# Patient Record
Sex: Female | Born: 1945 | Race: White | Hispanic: No | Marital: Married
Health system: Midwestern US, Academic
[De-identification: ages and names within clinical notes are randomized; demographics above are authoritative.]

---

## 2017-03-20 MED ORDER — CARVEDILOL 12.5 MG PO TAB
ORAL_TABLET | Freq: Two times a day (BID) | ORAL | 3 refills | 90.00000 days | Status: DC
Start: 2017-03-20 — End: 2018-04-08

## 2017-04-20 ENCOUNTER — Encounter: Admit: 2017-04-20 | Discharge: 2017-04-20 | Payer: MEDICARE

## 2017-04-20 NOTE — Telephone Encounter
-----   Message from Allen NorrisLisa Luikart, RN sent at 04/18/2017  2:25 PM CDT -----  Regarding: INR due 6/2  INR was 6.1 on Thursday (5/31). Pt will have rechecked on 6/2 at [facility], phone # is [phone] or the main hosp number is [phone].

## 2017-04-20 NOTE — Telephone Encounter
Called lab no inr drawn.  Called pt lmom requesting draw.

## 2017-04-21 ENCOUNTER — Encounter: Admit: 2017-04-21 | Discharge: 2017-04-21 | Payer: MEDICARE

## 2017-04-21 DIAGNOSIS — Z7901 Long term (current) use of anticoagulants: Principal | ICD-10-CM

## 2017-04-21 DIAGNOSIS — I480 Paroxysmal atrial fibrillation: ICD-10-CM

## 2017-04-21 DIAGNOSIS — Z8679 Personal history of other diseases of the circulatory system: ICD-10-CM

## 2017-04-21 NOTE — Progress Notes
INR 3.4 decreased from 6.1 - 3 days ago.        Assessment for high INR:    What doses of warfarin have you been taking? Holding X 2 days then 3mg  last evening  Have you had any extra doses? No  Have you had any diet changes (decreased foods high in Vitamin K)? No  Have you had any alcohol to drink?No  Any medication changes?  Yes - Antibiotics completedd 4 days ago  Antibiotic use? Yes but now completed  Any recent illness or hospitalization? Sinus Infection and Strep Throat  Have you had any surgery or other procedures? No  Have you had a fall or other injury No  Any unusual bruising or bleeding, severe headache,  frequent nosebleeds or bleeding gums, blood in urine, red or black stools, vomiting or coughing up blood, unexplained localized pain, swelling, or redness  No      Instructed patient to report any signs or symptoms of bleeding immediately.   Advised patient to avoid excessive physical activity and take care to prevent falls.  Instructed patient to call immediately with any fall or other injury.  Recommended additional servings of foods high in Vitamin K.

## 2017-04-24 ENCOUNTER — Encounter: Admit: 2017-04-24 | Discharge: 2017-04-24 | Payer: MEDICARE

## 2017-05-01 ENCOUNTER — Encounter: Admit: 2017-05-01 | Discharge: 2017-05-01 | Payer: MEDICARE

## 2017-05-10 ENCOUNTER — Encounter: Admit: 2017-05-10 | Discharge: 2017-05-10 | Payer: MEDICARE

## 2017-05-10 NOTE — Progress Notes
Spoke with pt relative to results, dosage and date of next INR.

## 2017-05-27 ENCOUNTER — Encounter: Admit: 2017-05-27 | Discharge: 2017-05-27 | Payer: MEDICARE

## 2017-05-27 DIAGNOSIS — I480 Paroxysmal atrial fibrillation: Principal | ICD-10-CM

## 2017-05-27 NOTE — Progress Notes
Faxed standing [facility] to [facility].

## 2017-06-05 ENCOUNTER — Encounter: Admit: 2017-06-05 | Discharge: 2017-06-05 | Payer: MEDICARE

## 2017-06-05 MED ORDER — WARFARIN 2 MG PO TAB
ORAL_TABLET | Freq: Every day | ORAL | 1 refills | 90.00000 days | Status: AC
Start: 2017-06-05 — End: 2017-12-20

## 2017-06-07 ENCOUNTER — Encounter: Admit: 2017-06-07 | Discharge: 2017-06-07 | Payer: MEDICARE

## 2017-06-26 ENCOUNTER — Encounter: Admit: 2017-06-26 | Discharge: 2017-06-26 | Payer: MEDICARE

## 2017-06-27 ENCOUNTER — Ambulatory Visit: Admit: 2017-06-27 | Discharge: 2017-06-28 | Payer: MEDICARE

## 2017-06-27 ENCOUNTER — Encounter: Admit: 2017-06-27 | Discharge: 2017-06-27 | Payer: MEDICARE

## 2017-06-27 DIAGNOSIS — I10 Essential (primary) hypertension: ICD-10-CM

## 2017-06-27 DIAGNOSIS — I471 Supraventricular tachycardia: ICD-10-CM

## 2017-06-27 DIAGNOSIS — E785 Hyperlipidemia, unspecified: ICD-10-CM

## 2017-06-27 DIAGNOSIS — D509 Iron deficiency anemia, unspecified: ICD-10-CM

## 2017-06-27 DIAGNOSIS — E119 Type 2 diabetes mellitus without complications: ICD-10-CM

## 2017-06-27 DIAGNOSIS — I480 Paroxysmal atrial fibrillation: ICD-10-CM

## 2017-06-27 DIAGNOSIS — Z7901 Long term (current) use of anticoagulants: ICD-10-CM

## 2017-06-27 DIAGNOSIS — E538 Deficiency of other specified B group vitamins: ICD-10-CM

## 2017-06-27 DIAGNOSIS — I4892 Unspecified atrial flutter: ICD-10-CM

## 2017-06-27 NOTE — Progress Notes
Patient is scheduled for sinus surgery at [facility] with Dr. Susann GivensFranklin on 8/22.  Pt is coumadin and will need to hold 5 days prior to surgery.  [facility] will address coumadin in OV note and addendum OV with testing is complete.     [facility] [phone] or call [phone] ext [phone].

## 2017-07-02 ENCOUNTER — Ambulatory Visit: Admit: 2017-07-02 | Discharge: 2017-07-02 | Payer: MEDICARE

## 2017-07-02 DIAGNOSIS — I480 Paroxysmal atrial fibrillation: Principal | ICD-10-CM

## 2017-07-02 DIAGNOSIS — I10 Essential (primary) hypertension: ICD-10-CM

## 2017-07-03 ENCOUNTER — Encounter: Admit: 2017-07-03 | Discharge: 2017-07-03 | Payer: MEDICARE

## 2017-07-03 NOTE — Telephone Encounter
-----   Message from Hester MatesSteven B Gollub, MD sent at 07/02/2017  3:20 PM CDT -----  Franchot MimesLisa and Steve: Ms. Culbreth's echo Doppler showed normal left ventricular systolic function with severe left atrial enlargement.  There are no surprises on her echo Doppler study.  Please let her know.  Thanks. SBG  ----- Message -----  From: Hester MatesGollub, Steven B, MD  Sent: 07/02/2017   3:19 PM  To: Hester MatesSteven B Gollub, MD

## 2017-07-03 NOTE — Telephone Encounter
Results and recommendations called to patient.

## 2017-07-04 ENCOUNTER — Encounter: Admit: 2017-07-04 | Discharge: 2017-07-04 | Payer: MEDICARE

## 2017-07-05 ENCOUNTER — Ambulatory Visit: Admit: 2017-07-05 | Discharge: 2017-07-06 | Payer: MEDICARE

## 2017-07-05 DIAGNOSIS — I480 Paroxysmal atrial fibrillation: Principal | ICD-10-CM

## 2017-07-05 DIAGNOSIS — I10 Essential (primary) hypertension: ICD-10-CM

## 2017-07-05 MED ORDER — SODIUM CHLORIDE 0.9 % IV SOLP
250 mL | INTRAVENOUS | 0 refills | Status: AC | PRN
Start: 2017-07-05 — End: ?

## 2017-07-05 MED ORDER — ALBUTEROL SULFATE 90 MCG/ACTUATION IN HFAA
2 | RESPIRATORY_TRACT | 0 refills | Status: DC | PRN
Start: 2017-07-05 — End: 2017-07-10

## 2017-07-05 MED ORDER — REGADENOSON 0.4 MG/5 ML IV SYRG
.4 mg | Freq: Once | INTRAVENOUS | 0 refills | Status: CP
Start: 2017-07-05 — End: ?

## 2017-07-05 MED ORDER — NITROGLYCERIN 0.4 MG SL SUBL
.4 mg | SUBLINGUAL | 0 refills | Status: AC | PRN
Start: 2017-07-05 — End: ?

## 2017-07-05 MED ORDER — AMINOPHYLLINE 500 MG/20 ML IV SOLN
50 mg | INTRAVENOUS | 0 refills | Status: AC | PRN
Start: 2017-07-05 — End: ?

## 2017-07-08 NOTE — Progress Notes
Risk stratification note faxed to [facility].

## 2017-07-17 LAB — COMPREHENSIVE METABOLIC PANEL
Lab: 0.7
Lab: 1.4 — ABNORMAL HIGH (ref 0.57–1.11)
Lab: 10
Lab: 134 — ABNORMAL LOW (ref 136–145)
Lab: 14
Lab: 15 — ABNORMAL HIGH (ref 0–14)
Lab: 19 — ABNORMAL HIGH (ref 0.57–1.11)
Lab: 228 — ABNORMAL HIGH (ref 80–115)
Lab: 23
Lab: 3.1 — ABNORMAL LOW (ref 3.4–4.8)
Lab: 37
Lab: 43
Lab: 7.8
Lab: 74
Lab: 8.9 — ABNORMAL HIGH (ref 11.5–14.5)

## 2017-07-17 LAB — TROPONIN-I

## 2017-07-17 LAB — CREATINE KINASE-CPK: Lab: 66 — ABNORMAL LOW (ref 37.0–47.0)

## 2017-07-18 ENCOUNTER — Encounter: Admit: 2017-07-18 | Discharge: 2017-07-18 | Payer: MEDICARE

## 2017-07-18 ENCOUNTER — Ambulatory Visit: Admit: 2017-07-18 | Discharge: 2017-07-19 | Payer: MEDICARE

## 2017-07-18 DIAGNOSIS — I2601 Septic pulmonary embolism with acute cor pulmonale: Principal | ICD-10-CM

## 2017-07-19 LAB — CBC: Lab: 7.4

## 2017-07-19 LAB — BASIC METABOLIC PANEL: Lab: 134 — ABNORMAL LOW (ref 136–145)

## 2017-07-31 ENCOUNTER — Encounter: Admit: 2017-07-31 | Discharge: 2017-07-31 | Payer: MEDICARE

## 2017-08-01 ENCOUNTER — Encounter: Admit: 2017-08-01 | Discharge: 2017-08-01 | Payer: MEDICARE

## 2017-08-01 LAB — COMPREHENSIVE METABOLIC PANEL
Lab: 0.4
Lab: 1.3 — ABNORMAL HIGH (ref 0.57–1.11)
Lab: 101
Lab: 13
Lab: 134 — ABNORMAL LOW (ref 136–145)
Lab: 156 — ABNORMAL HIGH (ref 80–115)
Lab: 16
Lab: 19
Lab: 20 — ABNORMAL LOW (ref 33.0–37.0)
Lab: 24
Lab: 3.1 — ABNORMAL LOW (ref 3.4–4.8)
Lab: 3.8
Lab: 8

## 2017-08-01 LAB — CBC
Lab: 4 — ABNORMAL LOW (ref 4.8–10.8)
Lab: 4.3

## 2017-08-01 LAB — TROPONIN-I

## 2017-08-01 LAB — MYOGLOBIN-CHEM: Lab: 74

## 2017-08-06 ENCOUNTER — Encounter: Admit: 2017-08-06 | Discharge: 2017-08-06 | Payer: MEDICARE

## 2017-08-06 ENCOUNTER — Ambulatory Visit: Admit: 2017-08-06 | Discharge: 2017-08-07 | Payer: MEDICARE

## 2017-08-06 DIAGNOSIS — D509 Iron deficiency anemia, unspecified: ICD-10-CM

## 2017-08-06 DIAGNOSIS — I10 Essential (primary) hypertension: Principal | ICD-10-CM

## 2017-08-06 DIAGNOSIS — I480 Paroxysmal atrial fibrillation: Secondary | ICD-10-CM

## 2017-08-06 DIAGNOSIS — I471 Supraventricular tachycardia: ICD-10-CM

## 2017-08-06 DIAGNOSIS — I4892 Unspecified atrial flutter: ICD-10-CM

## 2017-08-06 DIAGNOSIS — E538 Deficiency of other specified B group vitamins: ICD-10-CM

## 2017-08-06 DIAGNOSIS — R55 Syncope and collapse: Principal | ICD-10-CM

## 2017-08-06 DIAGNOSIS — E119 Type 2 diabetes mellitus without complications: ICD-10-CM

## 2017-08-06 DIAGNOSIS — Z7901 Long term (current) use of anticoagulants: ICD-10-CM

## 2017-08-06 DIAGNOSIS — E785 Hyperlipidemia, unspecified: ICD-10-CM

## 2017-08-06 MED ORDER — ENOXAPARIN 120 MG/0.8 ML SC SYRG
120 mg | INJECTION | Freq: Two times a day (BID) | SUBCUTANEOUS | 1 refills | 7.00000 days | Status: AC
Start: 2017-08-06 — End: 2017-09-19

## 2017-08-06 NOTE — Progress Notes
Date of Service: 08/06/2017    Sandra Parks is a 71 y.o. female.       HPI     Sandra Parks is followed for paroxysmal atrial flutter/fibrillation, hypertension, and supraventricular tachycardia.  She is also followed for hyperlipidemia and diabetes mellitus. ???The patient reports having a persistent sinus infection over the wintertime associated with ocular migraines.    She stopped her warfarin in preparation for sinus surgery and underwent sinus surgery the week of July 08, 2017.  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  She was hospitalized until 07/22/2017.  Her warfarin was restarted.  On 08/01/2017 she had a syncopal episode shortly after urinating and then standing.  She felt lightheaded prior to losing consciousness and she reports that she fractured her left hand and is being scheduled for surgery.   Sandra Parks also reports chronic back pain which limits her activity. ???Otherwise, from a cardiovascular perspective, the patient has been stable over the past month and she reports no angina, congestive symptoms, palpitations, or sensation of sustained forceful heart pounding. Her exercise tolerance has been stable but???is???limited by???chronic low back discomfort and???left knee arthritis. The patient reports no myalgias, bleeding abnormalities, neurologic motor abnormalities or difficulty with speech.  ???  Historically, in February 2004, she underwent radiofrequency ablation for atrial flutter, although this appeared to be only 1 of the supraventricular arrhythmias that she was having. She has been on Coumadin because her CHA2DS2-VASc score for stroke and systemic embolization risk is now 4, namely for gender, age, hypertension and diabetes mellitus.   In December 2012 warfarin was stopped 4-5 days prior to surgery to repair her left rotator cuff. The patient reports that surgery was uneventful. Warfarin was resumed after surgery but stopped, once again, 4-5 days prior to surgery to repair her right rotator cuff on 01/03/12. On 01/06/12 she became short of breath and on 01/07/12 she was admitted to [facility] with a diagnosis of pulmonary embolism. Sandra Parks was treated with unfractionated heparin and then warfarin was resumed. 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 She was hospitalized from 12/09/13 thru 12/11/13 for sinusitis but also reports treatment for bronchitis and pneumonia. She required treatment with steroids which made her blood pressure and blood sugar difficult to control. Sandra Parks reports that she was hospitalized again for approximately 4 days in February 2015 for a sinus infection requiring antibiotic therapy. The patient reports that she had her sinuses irrigated as an outpatient in March 2015 and has had no recurrent sinus infections. The patient fell on 01/19/15 and suffered a comminuted fracture of the distal left radius. She reports that she did not require surgery and was place in a protective sling. She reports one other fall. She states that she will become lightheaded with standing for 15 minutes and now knows to prevent prolonged standing. Due to her orthostatic symptoms, her hydralazine was discontinued and her carvedilol was reduced to 12.5 mg twice daily from 25 mg twice daily. She felt much better after this dose reduction.       Vitals:    08/06/17 1408   BP: 120/72   Pulse: 83   Weight: 122.5 kg (270 lb)   Height: 1.753 m (5' 9)     Body mass index is 39.87 kg/m???.     Past Medical History  Patient Active Problem List    Diagnosis Date Noted   ??? History of pulmonary embolism 05/28/2012   ???  Syncope 01/14/2012   ??? History of atrial flutter 01/14/2012     2004: RFA     ??? AKI (acute kidney injury) (HCC) 01/14/2012   ??? Chronic anticoagulation 01/14/2012   ??? Iron deficiency anemia 01/13/2012   ??? B12 deficiency 01/13/2012 ??? 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 Hypertension 07/12/2009   ??? Supraventricular tachycardia (HCC) 07/12/2009   ??? Hyperlipidemia 07/12/2009   ??? Type II diabetes mellitus (HCC) 07/12/2009   ??? PAF (paroxysmal atrial fibrillation) (HCC) 04/12/2009         Review of Systems   Constitution: Positive for weakness and malaise/fatigue.   HENT: Positive for nosebleeds and tinnitus.    Eyes: Negative.    Cardiovascular: Positive for dyspnea on exertion and leg swelling.   Endocrine: Positive for polydipsia.   Hematologic/Lymphatic: Negative.    Skin: Positive for dry skin.   Musculoskeletal: Positive for arthritis, back pain, falls, joint pain and myalgias.   Gastrointestinal: Positive for constipation.   Genitourinary: Positive for decreased libido.   Neurological: Positive for disturbances in coordination and loss of balance.   Psychiatric/Behavioral: Negative.    Allergic/Immunologic: Negative.        Physical Exam  GENERAL: The patient is well developed, well nourished, resting comfortably and in no distress. ???  HEENT: No abnormalities of the visible oro-nasopharynx, conjunctiva or sclera are noted.  NECK: There is no jugular venous distension. Carotids are palpable and without bruits. There is no thyroid enlargement.  Chest: Lung fields are clear to auscultation. There are no wheezes or crackles.  CV: There is a regular rhythm. The first and second heart sounds are normal. There are no murmurs, gallops or rubs. Her apical heart rate is 72???BPM.  ABD: The abdomen is soft and supple with normal bowel sounds. There is no hepatosplenomegaly, ascites, tenderness, masses or bruits.  Neuro: There are no focal motor defects. Ambulation is normal. Cognitive function appears normal.  Ext:???There is trace bipedal edema without evidence of deep vein thrombosis. She does have 1+ bilateral lower leg edema. ???Peripheral pulses are satisfactory. ???  SKIN:???There are no rashes and no cellulitis PSYCH:???The patient is calm, rationale and oriented.    Cardiovascular Studies  A 12 lead ECG was obtained on 08/06/2017 shows normal sinus rhythm with a heart rate of 72 bpm.  There is no evidence of myocardial ischemia or infarction.  There is no significant change when compared to her prior ECG from June 27, 2017.    An echo Doppler study obtained on 07/02/2017 revealed:  1. No regional wall motion abnormalities are seen. Overall LV systolic function appears normal. The estimated left ventricular ejection fraction is 55-60%. Left ventricular contractility appears similar when compared with the prior echocardiogram performed on 04/22/13.  2. Right ventricular contractility appears normal.  3. Severe left atrial enlargement. ???Mild right atrial enlargement  4. Mild mitral valve regurgitation.  5. No pericardial effusion is seen.  6. The estimated peak systolic PA pressure equals 44 mmHg.  ???  A regadenoson thallium stress study obtained on 07/05/2017 revealed:   Regional Wall Thickening and Motion Post Stress: ??????There is borderline normal left ventricular wall motion and thickening of all myocardial segments.  Left Ventricular Ejection Fraction (post stress, in the resting state) =??????49 %. Left Ventricular End Diastolic Volume: 469 mL. ???  SUMMARY/OPINION:??????This study is probably normal with no evidence of significant myocardial ischemia. There is fixed attenuation in the anterior and anterolateral wall that is likely due to breast attenuation althougha prior injury  cannot be excluded. ???Left ventricular systolic function is borderline normal. There are no high risk prognostic indicators present. ???The ECG portion of the study is negative for ischemia. Comparison is made with a prior study completed 201`4. ???Ejection fraction was 52%. ???There are no significant changes. The anterior attenuation that is seen on the current image was present on the prior study. In aggregate the current study is low risk in regards to predicted annual cardiovascular mortality rate.  ???  Problems Addressed Today  Paroxysmal atrial fibrillation.  Hypertension.  Supraventricular tachycardia.  Pulmonary embolism.  Assessment and Plan     A) Based upon the results of the patient's clinical profile and non-invasive cardiovascular testing, the risk for cardiovascular morbidity and mortality associated with non-cardiac surgery is ELEVATED. This by no means precludes surgery but provides information for the surgeon, anesthesiologist and patient concerning the risks of surgery.  A decision to proceed with surgery should be made based upon the relative benefits and risks involved. Indeed, orthopedic surgery for her broken left hand could be a very helpful intervention for this patient's overall health maintenance and could prove very helpful in providing comfort for the patient and preventing possible complications related to loss of mobility and function of her left hand. There are no absolute contra-indications to elective surgery, as deemed necessary. No additional cardiovascular testing is required at this time.  ???  B) Ms. Folkerts has paroxysmal atrial fibrillation. ???Her CHA2DS2-VASc score for stroke and systemic embolization risk is now 4, namely for gender, age, hypertension and diabetes mellitus. ???She also had a complication of pulmonary embolism following orthopedic surgery in 2013 and recently had recurrent pulmonary embolism when she held her warfarin in the perioperative period for sinus surgery. The risks and benefits of bridging anticoagulation therapy have been reviewed with the patient. The patient understands that anticoagulation is used to decrease thrombotic or clotting complications such as stroke, systemic embolization and pulmonary embolization which can be disabling or fatal, but can, on occasion, lead to life-threatening or disabling bleeding complications including gastrointestinal and intracranial hemorrhage. The patient wishes to use bridging anticoagulation when her warfarin is held in preparation of orthopedic surgery for her broken left hand.  A strict schedule for bridging anticoagulation was given to the patient.  Following upcoming orthopedic surgery I have asked Ms. Fuhrer to continue working with the cardiovascular nurses so that her INR can be monitored and her dose of warfarin adjusted accordingly. I have asked Ms. Espey to continue working with her primary care physician for control of her diabetes mellitus.     C) Ms. Goedken's recent syncope may be orthostatic, or reflex mediated.  It did occur after urination and with standing.  Her blood pressure is well controlled and I have asked her to stop her lisinopril which could help improve her chronic orthostasis.  She has had recent echocardiography and stress testing.  However in order to evaluate for arrhythmias, I have asked her to use an event monitor, a ZI0patch, as soon as it can be scheduled. The patient has been instructed not to drive and to avoid working with power tools, amusement rides such as roller coasters and ferris wheels or places of elevation such as rooftops or ladders where loss of consciousness could pose life-threatening harm to herself or others.  I have asked her to return for follow-up in approximately 1 month to follow her progress.            Current Medications (including today's revisions)  ??? carvedilol (  COREG) 12.5 mg tablet TAKE ONE TABLET BY MOUTH TWICE DAILY (MUST MAKE OFFICE VISIT FOR ANY ADDITIONAL REFILLS)   ??? glimepiride (AMARYL) 2 mg PO tablet Take 2 mg by mouth twice daily.   ??? insulin degludec 100 unit/mL (3 mL) inpn Inject 40 Units under the skin at bedtime daily.   ??? lisinopril (PRINIVIL; ZESTRIL) 10 mg tablet Take 10 mg by mouth daily.   ??? multivitamin (MULTIPLE VITAMIN) per tablet Take 1 Tab by mouth Daily.   ??? omeprazole (PRILOSEC) 40 mg capsule Take 40 mg by mouth Daily. ??? potassium chloride (KLOR-CON) 20 mEq packet Take 20 mEq by mouth daily.   ??? rosuvastatin (CRESTOR) 20 mg tablet Take 1 tablet by mouth daily.   ??? Saxagliptin 2.5 mg tab Take 1 tablet by mouth every morning.   ??? sertraline (ZOLOFT) 50 mg tablet Take 50 mg by mouth at bedtime daily.   ??? triamterene-hydrochlorothiazide (MAXZIDE) 37.5-25 mg tablet TAKE ONE TABLET BY MOUTH ONCE DAILY   ??? warfarin (COUMADIN) 2 mg tablet TAKE 1 TABLET BY MOUTH ONCE DAILY AS DIRECTED BY [facility] FOR MANAGEMENT OF INR   ??? warfarin (COUMADIN) 6 mg tablet TAKE ONE TABLET BY MOUTH ONCE DAILY AS DIRECTED

## 2017-08-06 NOTE — Progress Notes
Patient was identified as a high fall risk with fall risk screen today in clinic.   Yellow fall risk banner now active in patient's chart. High Fall risk sign posted outside of the patient's room.   Patient and family were given the CDC "How you can prevent falls" brochure and educated by clinic RN regarding fall risk prevention.     High Risk Assessment:    Three or more co-existing diagnosis: yes  Incontinency/urgency/frequency (diuretic use): no  Fall in the past 6 months: yes  Taking four or more prescribed medications (consider medications that could affect gait, balance, strength, judgement): yes  Impaired functional mobility: yes    Patient will be accompanied by a staff member while ambulating in clinic via assist or wheelchair. High fall risk status will be communicated to receiving staff if patient visits another department while under [place] staff care. Patient verbalizes an understanding.

## 2017-08-08 ENCOUNTER — Encounter: Admit: 2017-08-08 | Discharge: 2017-08-08 | Payer: MEDICARE

## 2017-08-09 ENCOUNTER — Encounter: Admit: 2017-08-09 | Discharge: 2017-08-09 | Payer: MEDICARE

## 2017-08-15 ENCOUNTER — Ambulatory Visit: Admit: 2017-08-15 | Discharge: 2017-08-16 | Payer: MEDICARE

## 2017-08-15 ENCOUNTER — Encounter: Admit: 2017-08-15 | Discharge: 2017-08-15 | Payer: MEDICARE

## 2017-08-15 DIAGNOSIS — I480 Paroxysmal atrial fibrillation: ICD-10-CM

## 2017-08-15 DIAGNOSIS — R55 Syncope and collapse: Principal | ICD-10-CM

## 2017-08-15 DIAGNOSIS — I471 Supraventricular tachycardia: ICD-10-CM

## 2017-08-16 ENCOUNTER — Encounter: Admit: 2017-08-16 | Discharge: 2017-08-16 | Payer: MEDICARE

## 2017-08-23 ENCOUNTER — Encounter: Admit: 2017-08-23 | Discharge: 2017-08-23 | Payer: MEDICARE

## 2017-08-23 DIAGNOSIS — Z8679 Personal history of other diseases of the circulatory system: ICD-10-CM

## 2017-08-23 DIAGNOSIS — I480 Paroxysmal atrial fibrillation: ICD-10-CM

## 2017-08-23 DIAGNOSIS — Z7901 Long term (current) use of anticoagulants: Principal | ICD-10-CM

## 2017-08-30 ENCOUNTER — Encounter: Admit: 2017-08-30 | Discharge: 2017-08-30 | Payer: MEDICARE

## 2017-08-30 DIAGNOSIS — I480 Paroxysmal atrial fibrillation: ICD-10-CM

## 2017-08-30 DIAGNOSIS — Z7901 Long term (current) use of anticoagulants: Principal | ICD-10-CM

## 2017-08-30 DIAGNOSIS — Z8679 Personal history of other diseases of the circulatory system: ICD-10-CM

## 2017-09-02 ENCOUNTER — Encounter: Admit: 2017-09-02 | Discharge: 2017-09-02 | Payer: MEDICARE

## 2017-09-06 ENCOUNTER — Encounter: Admit: 2017-09-06 | Discharge: 2017-09-06 | Payer: MEDICARE

## 2017-09-06 DIAGNOSIS — Z7901 Long term (current) use of anticoagulants: ICD-10-CM

## 2017-09-06 DIAGNOSIS — Z8679 Personal history of other diseases of the circulatory system: ICD-10-CM

## 2017-09-06 DIAGNOSIS — I480 Paroxysmal atrial fibrillation: Principal | ICD-10-CM

## 2017-09-19 ENCOUNTER — Encounter: Admit: 2017-09-19 | Discharge: 2017-09-19 | Payer: MEDICARE

## 2017-09-19 ENCOUNTER — Ambulatory Visit: Admit: 2017-09-19 | Discharge: 2017-09-20 | Payer: MEDICARE

## 2017-09-19 DIAGNOSIS — E785 Hyperlipidemia, unspecified: ICD-10-CM

## 2017-09-19 DIAGNOSIS — I480 Paroxysmal atrial fibrillation: Secondary | ICD-10-CM

## 2017-09-19 DIAGNOSIS — E538 Deficiency of other specified B group vitamins: ICD-10-CM

## 2017-09-19 DIAGNOSIS — I10 Essential (primary) hypertension: Principal | ICD-10-CM

## 2017-09-19 DIAGNOSIS — Z7901 Long term (current) use of anticoagulants: ICD-10-CM

## 2017-09-19 DIAGNOSIS — E119 Type 2 diabetes mellitus without complications: Secondary | ICD-10-CM

## 2017-09-19 DIAGNOSIS — I951 Orthostatic hypotension: ICD-10-CM

## 2017-09-19 DIAGNOSIS — D509 Iron deficiency anemia, unspecified: ICD-10-CM

## 2017-09-19 DIAGNOSIS — I471 Supraventricular tachycardia: ICD-10-CM

## 2017-09-19 DIAGNOSIS — I4892 Unspecified atrial flutter: ICD-10-CM

## 2017-09-19 NOTE — Progress Notes
Date of Service: 09/19/2017    Sandra Parks is a 71 y.o. female.       HPI     Sandra Parks is followed for paroxysmal atrial flutter/fibrillation, hypertension, and supraventricular tachycardia.  She is also followed for hyperlipidemia and diabetes mellitus. ???The patient reports having a persistent sinus infection over the wintertime 2017/2018 associated with ocular migraines. She stopped her warfarin in preparation for sinus surgery and underwent sinus surgery the week of July 08, 2017.  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  She was hospitalized until 07/22/2017.  Her warfarin was restarted.  On 08/01/2017 she had a syncopal episode shortly after urinating and then standing.  She felt lightheaded prior to losing consciousness and she reports that she fractured her left hand.  This was repaired surgically in late September 2018.  This time she received anticoagulation bridging when her warfarin was stopped in preparation for orthopedic surgery and she reports no complications from surgery.  She has restarted her warfarin.  When I saw Sandra Parks on August 06, 2017 her lisinopril was stopped to decrease the likelihood of orthostasis. She reports minimal if any orthostasis over the past month. ???Sandra Parks???also reports chronic back pain which limits her activity. ???Otherwise, from a cardiovascular perspective, the patient has been stable over the past month and she reports no angina, congestive symptoms, palpitations, or sensation of sustained forceful heart pounding. Her exercise tolerance has been stable but???is???limited by???chronic low back discomfort and???left knee arthritis. The patient reports no myalgias, bleeding abnormalities, neurologic motor abnormalities or difficulty with speech.  ???  Historically, in February 2004, she underwent radiofrequency ablation for atrial flutter, although this appeared to be only 1 of the supraventricular arrhythmias that she was having. She has been on Coumadin because her CHA2DS2-VASc score for stroke and systemic embolization risk is now 4, namely for gender, age, hypertension and diabetes mellitus. In December 2012 warfarin was stopped 4-5 days prior to surgery to repair her left rotator cuff. The patient reports that surgery was uneventful. Warfarin was resumed after surgery but stopped, once again, 4-5 days prior to surgery to repair her right rotator cuff on 01/03/12. On 01/06/12 she became short of breath and on 01/07/12 she was admitted to [facility] with a diagnosis of pulmonary embolism. Sandra Parks was treated with unfractionated heparin and then warfarin was resumed. 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 She was hospitalized from 12/09/13 thru 12/11/13 for sinusitis but also reports treatment for bronchitis and pneumonia. She required treatment with steroids which made her blood pressure and blood sugar difficult to control. Sandra Parks reports that she was hospitalized again for approximately 4 days in February 2015 for a sinus infection requiring antibiotic therapy. The patient reports that she had her sinuses irrigated as an outpatient in March 2015 and has had no recurrent sinus infections. The patient fell on 01/19/15 and suffered a comminuted fracture of the distal left radius. She reports that she did not require surgery and was place in a protective sling. She reports one other fall.  Her falls were associated with lightheaded which occurred with standing for 15 minutes. Due to her orthostatic symptoms, her hydralazine was discontinued and her carvedilol was reduced to 12.5 mg twice daily from 25 mg twice daily. She felt much better after this dose reduction.       Vitals:    09/19/17 1011 09/19/17 1021   BP: 124/84 128/82  Pulse: 63    Weight: 122.7 kg (270 lb 9.6 oz)    Height: 1.753 m (5' 9)      Body mass index is 39.96 kg/m???. Past Medical History  Patient Active Problem List    Diagnosis Date Noted   ??? History of pulmonary embolism 05/28/2012   ??? Syncope 01/14/2012   ??? History of atrial flutter 01/14/2012     2004: RFA     ??? AKI (acute kidney injury) (HCC) 01/14/2012   ??? Chronic anticoagulation 01/14/2012   ??? Iron deficiency anemia 01/13/2012   ??? B12 deficiency 01/13/2012   ??? 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 Hypertension 07/12/2009   ??? Supraventricular tachycardia (HCC) 07/12/2009   ??? Hyperlipidemia 07/12/2009   ??? Type II diabetes mellitus (HCC) 07/12/2009   ??? PAF (paroxysmal atrial fibrillation) (HCC) 04/12/2009         Review of Systems   Constitution: Positive for malaise/fatigue.   HENT: Positive for tinnitus.    Eyes: Negative.    Cardiovascular: Positive for dyspnea on exertion and leg swelling.   Respiratory: Positive for cough.    Endocrine: Positive for cold intolerance and heat intolerance.   Hematologic/Lymphatic: Negative.    Skin: Positive for dry skin.   Musculoskeletal: Positive for arthritis, back pain and neck pain.   Gastrointestinal: Positive for constipation and diarrhea.   Genitourinary: Negative.    Neurological: Negative.    Psychiatric/Behavioral: Negative.    Allergic/Immunologic: Negative.        Physical Exam  GENERAL: The patient is well developed, well nourished, resting comfortably and in no distress. ???  HEENT: No abnormalities of the visible oro-nasopharynx, conjunctiva or sclera are noted.  NECK: There is no jugular venous distension. Carotids are palpable and without bruits. There is no thyroid enlargement.  Chest: Lung fields are clear to auscultation. There are no wheezes or crackles.  CV: There is a regular rhythm. The first and second heart sounds are normal. There are no murmurs, gallops or rubs. Her apical heart rate is 64???BPM.  ABD: The abdomen is soft and supple with normal bowel sounds. There is no hepatosplenomegaly, ascites, tenderness, masses or bruits. Neuro: There are no focal motor defects. Ambulation is normal. Cognitive function appears normal.  Ext:???There is trace bipedal edema without evidence of deep vein thrombosis. She does have 1+ bilateral lower leg edema. ???Peripheral pulses are satisfactory. ???  SKIN:???There are no rashes and no cellulitis  PSYCH:???The patient is calm, rationale and oriented.    Cardiovascular Studies  A 12-lead ECG was obtained on 09/19/2017 reveals normal sinus rhythm with a heart rate of 63 bpm.  There is no evidence of myocardial ischemia or infarction.    An echo Doppler study obtained on 07/02/2017 revealed:  1. No regional wall motion abnormalities are seen. Overall LV systolic function appears normal. The estimated left ventricular ejection fraction is 55-60%. Left ventricular contractility appears similar when compared with the prior echocardiogram performed on 04/22/13.  2. Right ventricular contractility appears normal.  3. Severe left atrial enlargement. ???Mild right atrial enlargement  4. Mild mitral valve regurgitation.  5. No pericardial effusion is seen.  6. The estimated peak systolic PA pressure equals 44 mmHg.  ???  A regadenoson thallium stress study obtained on 07/05/2017 revealed:   Regional Wall Thickening and Motion Post Stress: ??????There is borderline normal left ventricular wall motion and thickening of all myocardial segments. ???Left Ventricular Ejection Fraction (post stress, in the resting state) =??????49 %. Left Ventricular End  Diastolic Volume: 086 mL. ???  SUMMARY/OPINION:??????This study is probably normal with no evidence of significant myocardial ischemia. There is fixed attenuation in the anterior and anterolateral wall that is likely due to breast attenuation althougha prior injury cannot be excluded. ???Left ventricular systolic function is borderline normal. There are no high risk prognostic indicators present. ???The ECG portion of the study is negative for ischemia. Comparison is made with a prior study completed 201`4. ???Ejection fraction was 52%. ???There are no significant changes. The anterior attenuation that is seen on the current image was present on the prior study. In aggregate the current study is low risk in regards to predicted annual cardiovascular mortality rate.    Problems Addressed Today  Paroxysmal atrial fibrillation.  Hypertension.  Hypercholesterolemia.  Assessment and Plan     Sandra Parks now appears to have a good antihypertensive regimen which is not causing symptomatic orthostasis. Sandra Parks has paroxysmal atrial fibrillation. ???Her CHA2DS2-VASc score for stroke and systemic embolization risk is now 4, namely for gender, age, hypertension and diabetes mellitus. ???She also had a complication of pulmonary embolism following orthopedic surgery in 2013 and recently had recurrent pulmonary embolism when she held her warfarin in the perioperative period for sinus surgery in 2018. The risks and benefits of bridging anticoagulation therapy have been reviewed with the patient. The patient understands that anticoagulation is used to decrease thrombotic or clotting complications such as stroke, systemic embolization and pulmonary embolization which can be disabling or fatal, but can, on occasion, lead to life-threatening or disabling bleeding complications including gastrointestinal and intracranial hemorrhage. Anticoagulation options were presented to the patient which included warfarin or one of the newer direct acting oral anticoagulants and the patient wanted to continue warfarin. I have asked Sandra Parks to continue working with the cardiovascular nurses so that her INR can be monitored and her dose of warfarin adjusted accordingly. I have asked Sandra Parks to continue working with her primary care physician for control of her diabetes mellitus.???I have asked Sandra Parks to return for follow-up in 3 months. Current Medications (including today's revisions)  ??? carvedilol (COREG) 12.5 mg tablet TAKE ONE TABLET BY MOUTH TWICE DAILY (MUST MAKE OFFICE VISIT FOR ANY ADDITIONAL REFILLS)   ??? glimepiride (AMARYL) 2 mg PO tablet Take 2 mg by mouth twice daily.   ??? insulin degludec 100 unit/mL (3 mL) inpn Inject 40 Units under the skin at bedtime daily.   ??? multivitamin (MULTIPLE VITAMIN) per tablet Take 1 Tab by mouth Daily.   ??? omeprazole (PRILOSEC) 40 mg capsule Take 40 mg by mouth Daily.   ??? potassium chloride (KLOR-CON) 20 mEq packet Take 20 mEq by mouth daily.   ??? rosuvastatin (CRESTOR) 20 mg tablet Take 1 tablet by mouth daily.   ??? Saxagliptin 2.5 mg tab Take 1 tablet by mouth every morning.   ??? sertraline (ZOLOFT) 50 mg tablet Take 50 mg by mouth at bedtime daily.   ??? triamterene-hydrochlorothiazide (MAXZIDE) 37.5-25 mg tablet TAKE ONE TABLET BY MOUTH ONCE DAILY   ??? warfarin (COUMADIN) 2 mg tablet TAKE 1 TABLET BY MOUTH ONCE DAILY AS DIRECTED BY [facility] FOR MANAGEMENT OF INR   ??? warfarin (COUMADIN) 6 mg tablet TAKE ONE TABLET BY MOUTH ONCE DAILY AS DIRECTED

## 2017-09-20 ENCOUNTER — Encounter: Admit: 2017-09-20 | Discharge: 2017-09-20 | Payer: MEDICARE

## 2017-09-20 DIAGNOSIS — Z8679 Personal history of other diseases of the circulatory system: ICD-10-CM

## 2017-09-20 DIAGNOSIS — Z7901 Long term (current) use of anticoagulants: Principal | ICD-10-CM

## 2017-09-20 DIAGNOSIS — I480 Paroxysmal atrial fibrillation: ICD-10-CM

## 2017-10-18 ENCOUNTER — Encounter: Admit: 2017-10-18 | Discharge: 2017-10-18 | Payer: MEDICARE

## 2017-11-15 ENCOUNTER — Encounter: Admit: 2017-11-15 | Discharge: 2017-11-15 | Payer: MEDICARE

## 2017-11-15 MED ORDER — WARFARIN 6 MG PO TAB
ORAL_TABLET | Freq: Every day | ORAL | 3 refills | 90.00000 days | Status: AC
Start: 2017-11-15 — End: 2019-05-04

## 2017-11-15 NOTE — Progress Notes
LM for pt relative to results, dosage and date of next INR. Advised to call for validation

## 2017-11-18 ENCOUNTER — Encounter: Admit: 2017-11-18 | Discharge: 2017-11-18 | Payer: MEDICARE

## 2017-11-28 ENCOUNTER — Encounter: Admit: 2017-11-28 | Discharge: 2017-11-28 | Payer: MEDICARE

## 2017-11-28 DIAGNOSIS — Z8679 Personal history of other diseases of the circulatory system: ICD-10-CM

## 2017-11-28 DIAGNOSIS — Z7901 Long term (current) use of anticoagulants: Principal | ICD-10-CM

## 2017-11-28 DIAGNOSIS — I480 Paroxysmal atrial fibrillation: ICD-10-CM

## 2017-12-19 ENCOUNTER — Encounter: Admit: 2017-12-19 | Discharge: 2017-12-19 | Payer: MEDICARE

## 2017-12-20 ENCOUNTER — Encounter: Admit: 2017-12-20 | Discharge: 2017-12-20 | Payer: MEDICARE

## 2017-12-20 MED ORDER — WARFARIN 2 MG PO TAB
ORAL_TABLET | Freq: Every day | ORAL | 1 refills | 90.00000 days | Status: AC
Start: 2017-12-20 — End: 2018-06-18

## 2018-01-02 ENCOUNTER — Encounter: Admit: 2018-01-02 | Discharge: 2018-01-02 | Payer: MEDICARE

## 2018-01-02 DIAGNOSIS — Z8679 Personal history of other diseases of the circulatory system: ICD-10-CM

## 2018-01-02 DIAGNOSIS — Z7901 Long term (current) use of anticoagulants: Principal | ICD-10-CM

## 2018-01-02 DIAGNOSIS — I480 Paroxysmal atrial fibrillation: ICD-10-CM

## 2018-01-02 LAB — PROTIME INR (PT): Lab: 2.6

## 2018-01-24 ENCOUNTER — Encounter: Admit: 2018-01-24 | Discharge: 2018-01-24 | Payer: MEDICARE

## 2018-01-29 ENCOUNTER — Encounter: Admit: 2018-01-29 | Discharge: 2018-01-29 | Payer: MEDICARE

## 2018-01-30 ENCOUNTER — Ambulatory Visit: Admit: 2018-01-30 | Discharge: 2018-01-31 | Payer: MEDICARE

## 2018-01-30 ENCOUNTER — Encounter: Admit: 2018-01-30 | Discharge: 2018-01-30 | Payer: MEDICARE

## 2018-01-30 DIAGNOSIS — I480 Paroxysmal atrial fibrillation: Secondary | ICD-10-CM

## 2018-01-30 DIAGNOSIS — I4892 Unspecified atrial flutter: ICD-10-CM

## 2018-01-30 DIAGNOSIS — E538 Deficiency of other specified B group vitamins: ICD-10-CM

## 2018-01-30 DIAGNOSIS — D509 Iron deficiency anemia, unspecified: ICD-10-CM

## 2018-01-30 DIAGNOSIS — I10 Essential (primary) hypertension: Principal | ICD-10-CM

## 2018-01-30 DIAGNOSIS — E785 Hyperlipidemia, unspecified: ICD-10-CM

## 2018-01-30 DIAGNOSIS — Z7901 Long term (current) use of anticoagulants: ICD-10-CM

## 2018-01-30 DIAGNOSIS — E119 Type 2 diabetes mellitus without complications: ICD-10-CM

## 2018-01-30 DIAGNOSIS — I471 Supraventricular tachycardia: ICD-10-CM

## 2018-02-05 ENCOUNTER — Encounter: Admit: 2018-02-05 | Discharge: 2018-02-05 | Payer: MEDICARE

## 2018-02-05 DIAGNOSIS — I480 Paroxysmal atrial fibrillation: Principal | ICD-10-CM

## 2018-02-05 LAB — PROTIME INR (PT): Lab: 2.6 mL/min (ref >60)

## 2018-02-06 ENCOUNTER — Encounter: Admit: 2018-02-06 | Discharge: 2018-02-06 | Payer: MEDICARE

## 2018-02-13 ENCOUNTER — Encounter: Admit: 2018-02-13 | Discharge: 2018-02-13 | Payer: MEDICARE

## 2018-02-13 MED ORDER — ROSUVASTATIN 20 MG PO TAB
ORAL_TABLET | Freq: Every day | ORAL | 3 refills | 90.00000 days | Status: AC
Start: 2018-02-13 — End: 2019-02-20

## 2018-02-19 LAB — BASIC METABOLIC PANEL
Lab: 1.1 MMOL/L — ABNORMAL HIGH (ref 0.57–1.11)
Lab: 100 g/dL — ABNORMAL HIGH (ref <100)
Lab: 13 U/L (ref 7–40)
Lab: 140 g/dL (ref 6.0–8.0)
Lab: 146 U/L — ABNORMAL HIGH (ref 80–115)
Lab: 15 K/UL — ABNORMAL HIGH (ref 0–14)
Lab: 29 U/L (ref 25–110)
Lab: 4.3 mg/dL (ref 0.3–1.2)
Lab: 9.4 K/UL — ABNORMAL LOW (ref 3–12)

## 2018-02-19 LAB — ALT (SGPT): Lab: 13 mg/dL — ABNORMAL HIGH (ref 0.4–1.00)

## 2018-02-19 LAB — LIPID PROFILE: Lab: 176 mg/dL (ref 8.5–10.6)

## 2018-02-20 ENCOUNTER — Encounter: Admit: 2018-02-20 | Discharge: 2018-02-20 | Payer: MEDICARE

## 2018-02-20 DIAGNOSIS — I10 Essential (primary) hypertension: Principal | ICD-10-CM

## 2018-02-20 DIAGNOSIS — E785 Hyperlipidemia, unspecified: ICD-10-CM

## 2018-02-21 ENCOUNTER — Encounter: Admit: 2018-02-21 | Discharge: 2018-02-21 | Payer: MEDICARE

## 2018-02-25 ENCOUNTER — Encounter: Admit: 2018-02-25 | Discharge: 2018-02-25 | Payer: MEDICARE

## 2018-03-19 ENCOUNTER — Encounter: Admit: 2018-03-19 | Discharge: 2018-03-19 | Payer: MEDICARE

## 2018-03-19 MED ORDER — TRIAMTERENE-HYDROCHLOROTHIAZID 37.5-25 MG PO TAB
ORAL_TABLET | Freq: Every day | ORAL | 3 refills | Status: AC
Start: 2018-03-19 — End: 2019-05-04

## 2018-03-21 ENCOUNTER — Encounter: Admit: 2018-03-21 | Discharge: 2018-03-21 | Payer: MEDICARE

## 2018-03-21 DIAGNOSIS — Z8679 Personal history of other diseases of the circulatory system: ICD-10-CM

## 2018-03-21 DIAGNOSIS — I480 Paroxysmal atrial fibrillation: ICD-10-CM

## 2018-03-21 DIAGNOSIS — Z7901 Long term (current) use of anticoagulants: Principal | ICD-10-CM

## 2018-03-21 LAB — PROTIME INR (PT): Lab: 2.3

## 2018-04-08 ENCOUNTER — Encounter: Admit: 2018-04-08 | Discharge: 2018-04-08 | Payer: MEDICARE

## 2018-04-08 MED ORDER — CARVEDILOL 12.5 MG PO TAB
ORAL_TABLET | Freq: Two times a day (BID) | ORAL | 3 refills | 90.00000 days | Status: AC
Start: 2018-04-08 — End: 2019-05-04

## 2018-04-18 ENCOUNTER — Encounter: Admit: 2018-04-18 | Discharge: 2018-04-18 | Payer: MEDICARE

## 2018-04-18 DIAGNOSIS — I480 Paroxysmal atrial fibrillation: Principal | ICD-10-CM

## 2018-04-18 LAB — PROTIME INR (PT): Lab: 2.5 mg/dL (ref <0.4)

## 2018-05-19 ENCOUNTER — Encounter: Admit: 2018-05-19 | Discharge: 2018-05-19 | Payer: MEDICARE

## 2018-05-19 DIAGNOSIS — I480 Paroxysmal atrial fibrillation: Principal | ICD-10-CM

## 2018-05-19 LAB — PROTIME INR (PT): Lab: 3 % (ref 41–77)

## 2018-06-16 ENCOUNTER — Encounter: Admit: 2018-06-16 | Discharge: 2018-06-16 | Payer: MEDICARE

## 2018-06-18 ENCOUNTER — Encounter: Admit: 2018-06-18 | Discharge: 2018-06-18 | Payer: MEDICARE

## 2018-06-18 MED ORDER — WARFARIN 2 MG PO TAB
ORAL_TABLET | Freq: Every day | ORAL | 1 refills | 90.00000 days | Status: AC
Start: 2018-06-18 — End: 2018-12-16

## 2018-07-15 ENCOUNTER — Encounter: Admit: 2018-07-15 | Discharge: 2018-07-15 | Payer: MEDICARE

## 2018-07-15 DIAGNOSIS — Z8679 Personal history of other diseases of the circulatory system: ICD-10-CM

## 2018-07-15 DIAGNOSIS — I480 Paroxysmal atrial fibrillation: ICD-10-CM

## 2018-07-15 DIAGNOSIS — Z7901 Long term (current) use of anticoagulants: Principal | ICD-10-CM

## 2018-07-15 LAB — PROTIME INR (PT): Lab: 1.9

## 2018-07-30 ENCOUNTER — Encounter: Admit: 2018-07-30 | Discharge: 2018-07-30 | Payer: MEDICARE

## 2018-07-30 DIAGNOSIS — I480 Paroxysmal atrial fibrillation: ICD-10-CM

## 2018-07-30 DIAGNOSIS — Z7901 Long term (current) use of anticoagulants: Principal | ICD-10-CM

## 2018-07-30 DIAGNOSIS — Z8679 Personal history of other diseases of the circulatory system: ICD-10-CM

## 2018-07-30 LAB — PROTIME INR (PT): Lab: 1.9

## 2018-08-07 ENCOUNTER — Encounter: Admit: 2018-08-07 | Discharge: 2018-08-07 | Payer: MEDICARE

## 2018-08-07 DIAGNOSIS — Z8679 Personal history of other diseases of the circulatory system: ICD-10-CM

## 2018-08-07 DIAGNOSIS — I480 Paroxysmal atrial fibrillation: ICD-10-CM

## 2018-08-07 DIAGNOSIS — Z7901 Long term (current) use of anticoagulants: Principal | ICD-10-CM

## 2018-08-07 LAB — PROTIME INR (PT): Lab: 2.3

## 2018-08-13 IMAGING — RF HAND LT, 2 VIEWS
1 series · 2 of 2 positions shown · non-contrast
Comparison: none

[Series 1: run · 2 of 2 slices shown]
[im 1/2]
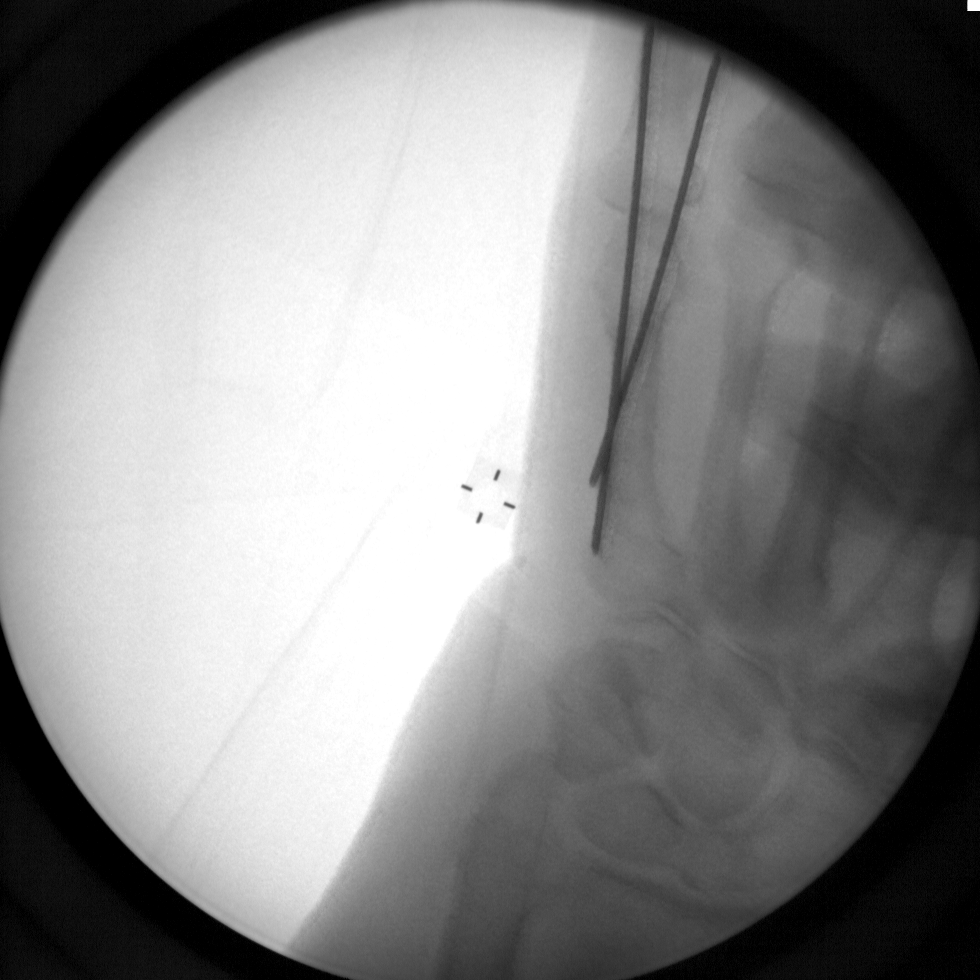
[im 2/2]
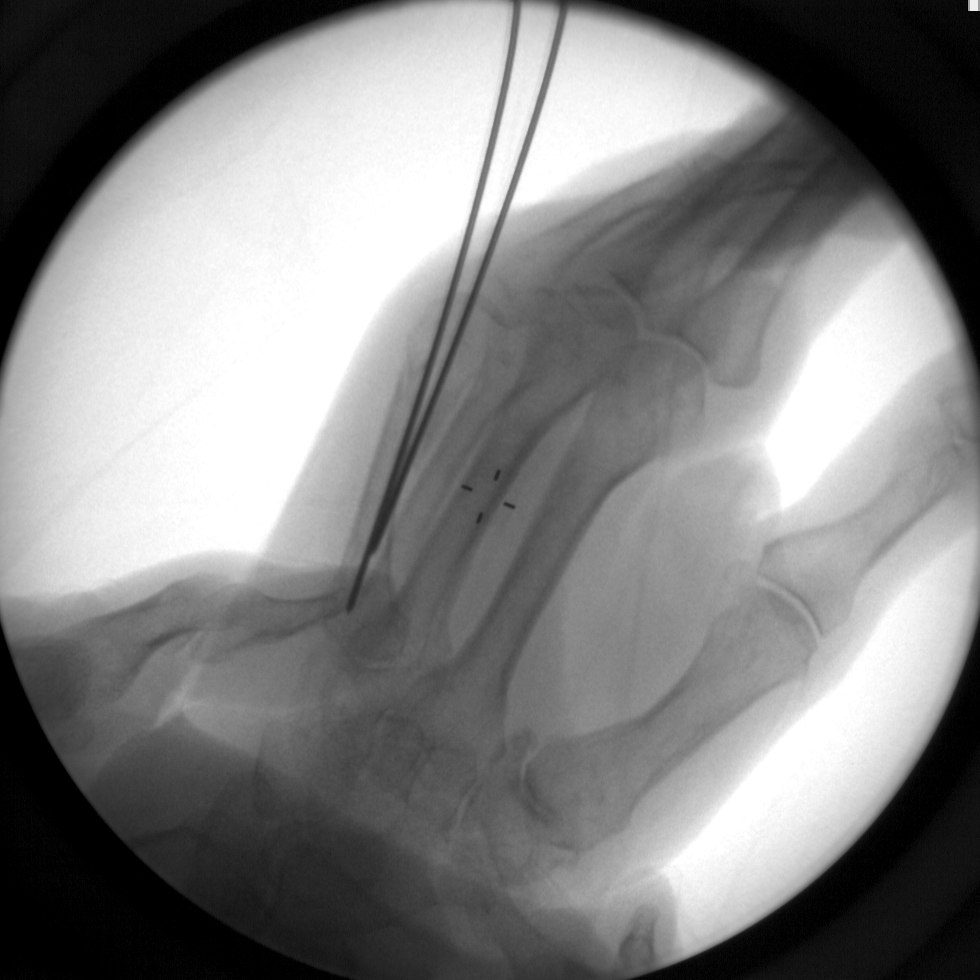

[2 of 2 positions shown; findings below may reference images not displayed]

DIAGNOSTIC STUDIES

EXAM

LEFT HAND

INDICATION

CLOSED REDUCTION PINNING. INTRAPROCEDURAL FLUOROSCOPY AND IMAGES
CLOSED REDUCTION.

COMPARISONS

Left hand 08/02/2007

PROCEDURE AND FINDINGS

Fluoro time: 13.3 sac All CT scans at this facility use dose modulation, iterative reconstruction,
and/or weight based dosing when appropriate to reduce radiation dose to as low as reasonably
achievable.

Two intraprocedural images obtained with C-arm technique are submitted. Two long pins are noted to
be internally fixing the oblique fracture of the distal left 5th metacarpal. Improvement of
alignment of fracture fragments with decreased apex dorsal angulation is noted. Degenerative
changes are noted at the 1st carpal metacarpal joint. Further evaluation is precluded by
intraprocedural technique.

IMPRESSION

Intraprocedural images as described above.

## 2018-09-05 ENCOUNTER — Encounter: Admit: 2018-09-05 | Discharge: 2018-09-05 | Payer: MEDICARE

## 2018-09-05 LAB — PROTIME INR (PT): Lab: 3.1

## 2018-09-08 ENCOUNTER — Encounter: Admit: 2018-09-08 | Discharge: 2018-09-08 | Payer: MEDICARE

## 2018-09-08 DIAGNOSIS — Z8679 Personal history of other diseases of the circulatory system: ICD-10-CM

## 2018-09-08 DIAGNOSIS — I480 Paroxysmal atrial fibrillation: ICD-10-CM

## 2018-09-08 DIAGNOSIS — Z7901 Long term (current) use of anticoagulants: Principal | ICD-10-CM

## 2018-09-09 ENCOUNTER — Encounter: Admit: 2018-09-09 | Discharge: 2018-09-09 | Payer: MEDICARE

## 2018-09-09 DIAGNOSIS — I480 Paroxysmal atrial fibrillation: Principal | ICD-10-CM

## 2018-09-12 ENCOUNTER — Encounter: Admit: 2018-09-12 | Discharge: 2018-09-12 | Payer: MEDICARE

## 2018-09-12 DIAGNOSIS — I480 Paroxysmal atrial fibrillation: Principal | ICD-10-CM

## 2018-09-12 LAB — PROTIME INR (PT)
Lab: 2.5
Lab: 2.5

## 2018-10-03 ENCOUNTER — Encounter: Admit: 2018-10-03 | Discharge: 2018-10-03 | Payer: MEDICARE

## 2018-10-06 LAB — PROTIME INR (PT): Lab: 2

## 2018-10-07 ENCOUNTER — Encounter: Admit: 2018-10-07 | Discharge: 2018-10-07 | Payer: MEDICARE

## 2018-10-07 DIAGNOSIS — I480 Paroxysmal atrial fibrillation: ICD-10-CM

## 2018-10-07 DIAGNOSIS — Z8679 Personal history of other diseases of the circulatory system: ICD-10-CM

## 2018-10-07 DIAGNOSIS — Z7901 Long term (current) use of anticoagulants: Principal | ICD-10-CM

## 2018-10-23 LAB — COMPREHENSIVE METABOLIC PANEL
Lab: 0.4
Lab: 1 — ABNORMAL LOW (ref 33.0–37.0)
Lab: 106
Lab: 108
Lab: 140 — ABNORMAL LOW (ref 4.20–5.40)
Lab: 16
Lab: 20
Lab: 26
Lab: 3 — ABNORMAL LOW (ref 3.4–4.8)
Lab: 35 — ABNORMAL HIGH (ref 5–34)
Lab: 4.4
Lab: 6.5 — ABNORMAL HIGH (ref 11.5–14.5)
Lab: 60
Lab: 8.7

## 2018-10-23 LAB — CBC: Lab: 4.6 — ABNORMAL LOW (ref 4.8–10.8)

## 2018-11-04 ENCOUNTER — Encounter: Admit: 2018-11-04 | Discharge: 2018-11-04 | Payer: MEDICARE

## 2018-11-04 DIAGNOSIS — I480 Paroxysmal atrial fibrillation: Principal | ICD-10-CM

## 2018-11-04 DIAGNOSIS — Z8679 Personal history of other diseases of the circulatory system: ICD-10-CM

## 2018-11-04 DIAGNOSIS — Z7901 Long term (current) use of anticoagulants: Principal | ICD-10-CM

## 2018-11-04 LAB — PROTIME INR (PT): Lab: 1.4

## 2018-11-17 ENCOUNTER — Encounter: Admit: 2018-11-17 | Discharge: 2018-11-17 | Payer: MEDICARE

## 2018-11-17 LAB — PROTIME INR (PT): Lab: 2.4

## 2018-11-18 ENCOUNTER — Ambulatory Visit: Admit: 2018-11-18 | Discharge: 2018-11-18 | Payer: MEDICARE

## 2018-11-18 ENCOUNTER — Encounter: Admit: 2018-11-18 | Discharge: 2018-11-18 | Payer: MEDICARE

## 2018-11-18 DIAGNOSIS — Z7901 Long term (current) use of anticoagulants: ICD-10-CM

## 2018-11-18 DIAGNOSIS — D509 Iron deficiency anemia, unspecified: ICD-10-CM

## 2018-11-18 DIAGNOSIS — I480 Paroxysmal atrial fibrillation: Secondary | ICD-10-CM

## 2018-11-18 DIAGNOSIS — I4892 Unspecified atrial flutter: ICD-10-CM

## 2018-11-18 DIAGNOSIS — E119 Type 2 diabetes mellitus without complications: ICD-10-CM

## 2018-11-18 DIAGNOSIS — E785 Hyperlipidemia, unspecified: ICD-10-CM

## 2018-11-18 DIAGNOSIS — I10 Essential (primary) hypertension: Principal | ICD-10-CM

## 2018-11-18 DIAGNOSIS — E538 Deficiency of other specified B group vitamins: ICD-10-CM

## 2018-11-18 DIAGNOSIS — I471 Supraventricular tachycardia: ICD-10-CM

## 2018-11-26 ENCOUNTER — Encounter: Admit: 2018-11-26 | Discharge: 2018-11-26 | Payer: MEDICARE

## 2018-12-15 ENCOUNTER — Encounter: Admit: 2018-12-15 | Discharge: 2018-12-15 | Payer: MEDICARE

## 2018-12-16 ENCOUNTER — Encounter: Admit: 2018-12-16 | Discharge: 2018-12-16 | Payer: MEDICARE

## 2018-12-16 MED ORDER — WARFARIN 2 MG PO TAB
ORAL_TABLET | Freq: Every day | ORAL | 1 refills | 90.00000 days | Status: AC
Start: 2018-12-16 — End: 2019-05-18

## 2018-12-19 LAB — PROTIME INR (PT): Lab: 2.9

## 2018-12-22 ENCOUNTER — Encounter: Admit: 2018-12-22 | Discharge: 2018-12-22 | Payer: MEDICARE

## 2018-12-22 DIAGNOSIS — Z7901 Long term (current) use of anticoagulants: Principal | ICD-10-CM

## 2018-12-22 DIAGNOSIS — I480 Paroxysmal atrial fibrillation: ICD-10-CM

## 2018-12-22 DIAGNOSIS — Z8679 Personal history of other diseases of the circulatory system: ICD-10-CM

## 2019-01-22 ENCOUNTER — Encounter: Admit: 2019-01-22 | Discharge: 2019-01-22 | Payer: MEDICARE

## 2019-01-22 DIAGNOSIS — Z7901 Long term (current) use of anticoagulants: Principal | ICD-10-CM

## 2019-01-22 DIAGNOSIS — Z8679 Personal history of other diseases of the circulatory system: ICD-10-CM

## 2019-01-22 DIAGNOSIS — I480 Paroxysmal atrial fibrillation: ICD-10-CM

## 2019-01-22 LAB — PROTIME INR (PT): Lab: 3.4

## 2019-02-19 ENCOUNTER — Encounter: Admit: 2019-02-19 | Discharge: 2019-02-19 | Payer: MEDICARE

## 2019-02-20 ENCOUNTER — Encounter: Admit: 2019-02-20 | Discharge: 2019-02-20 | Payer: MEDICARE

## 2019-02-20 MED ORDER — ROSUVASTATIN 20 MG PO TAB
ORAL_TABLET | Freq: Every day | ORAL | 3 refills | 90.00000 days | Status: DC
Start: 2019-02-20 — End: 2020-02-26

## 2019-02-24 LAB — PROTIME INR (PT): Lab: 2.3

## 2019-02-25 ENCOUNTER — Encounter: Admit: 2019-02-25 | Discharge: 2019-02-25 | Payer: MEDICARE

## 2019-02-25 DIAGNOSIS — I480 Paroxysmal atrial fibrillation: ICD-10-CM

## 2019-02-25 DIAGNOSIS — Z8679 Personal history of other diseases of the circulatory system: ICD-10-CM

## 2019-02-25 DIAGNOSIS — Z7901 Long term (current) use of anticoagulants: Principal | ICD-10-CM

## 2019-02-26 ENCOUNTER — Ambulatory Visit: Admit: 2019-02-26 | Discharge: 2019-02-27 | Payer: MEDICARE

## 2019-02-26 ENCOUNTER — Encounter: Admit: 2019-02-26 | Discharge: 2019-02-26 | Payer: MEDICARE

## 2019-02-26 DIAGNOSIS — E538 Deficiency of other specified B group vitamins: ICD-10-CM

## 2019-02-26 DIAGNOSIS — E785 Hyperlipidemia, unspecified: ICD-10-CM

## 2019-02-26 DIAGNOSIS — Z7901 Long term (current) use of anticoagulants: ICD-10-CM

## 2019-02-26 DIAGNOSIS — I10 Essential (primary) hypertension: Principal | ICD-10-CM

## 2019-02-26 DIAGNOSIS — I4892 Unspecified atrial flutter: ICD-10-CM

## 2019-02-26 DIAGNOSIS — I480 Paroxysmal atrial fibrillation: Principal | ICD-10-CM

## 2019-02-26 DIAGNOSIS — I471 Supraventricular tachycardia: ICD-10-CM

## 2019-02-26 DIAGNOSIS — E119 Type 2 diabetes mellitus without complications: Secondary | ICD-10-CM

## 2019-02-26 DIAGNOSIS — D509 Iron deficiency anemia, unspecified: ICD-10-CM

## 2019-02-26 NOTE — Progress Notes
Obtained patient's verbal consent to treat them and their agreement to [facility] financial policy and NPP via this telehealth visit during the [facility] Emergency    Date of Service: 02/26/2019    Sandra Parks is a 73 y.o. female.       HPI     This encounter was conducted as an electronic visit through the [employer] where I could see and speak with the patient directly.  Sandra Parks is followed for paroxysmal atrial flutter/fibrillation, hypertension, and supraventricular tachycardia. ???She is also followed for hyperlipidemia and diabetes mellitus. Sandra Parks has been receiving therapy her depression which she indicates is well controlled.  The patient indicates that she has been very compliant taking her medications as directed. Sandra Parks reports chronic back pain which limits her activity. ???Otherwise, from a cardiovascular perspective, the patient has been stable over the past 3???months???and???she???reports no angina, congestive symptoms, palpitations, or sensation of sustained forceful heart pounding. Her exercise tolerance has been stable but???is???limited by???chronic low back discomfort and???left knee arthritis. The patient reports no myalgias, bleeding abnormalities, neurologic motor abnormalities or difficulty with speech.  ???  Historically, in February 2004, she underwent radiofrequency ablation for atrial flutter, although this appeared to be only 1 of the supraventricular arrhythmias that she was having. She has been on Coumadin because her CHA2DS2-VASc score for stroke and systemic embolization risk is now 4, namely for gender, age, hypertension and diabetes mellitus. In December 2012 warfarin was stopped 4-5 days prior to surgery to repair her left rotator cuff. The patient reports that surgery was uneventful. Warfarin was resumed after surgery but stopped, once again, 4-5 days prior to surgery to repair her right rotator cuff on 01/03/12. On 01/06/12 she Her INR on 02/24/2019 was therapeutic at 2.3.    An echo Doppler study obtained on 07/02/2017 revealed:  1. No regional wall motion abnormalities are seen. Overall LV systolic function appears normal. The estimated left ventricular ejection fraction is 55-60%. Left ventricular contractility appears similar when compared with the prior echocardiogram performed on 04/22/13.  2. Right ventricular contractility appears normal.  3. Severe left atrial enlargement. ???Mild right atrial enlargement  4. Mild mitral valve regurgitation.  5. No pericardial effusion is seen.  6. The estimated peak systolic PA pressure equals 44 mmHg.  ???  A regadenoson thallium stress study obtained on 07/05/2017 revealed:???  Regional Wall Thickening and Motion Post Stress: ??????There is borderline normal left ventricular wall motion and thickening of all myocardial segments. ???Left Ventricular Ejection Fraction (post stress, in the resting state) =??????49 %. Left Ventricular End Diastolic Volume: 161 mL. ???  SUMMARY/OPINION:??????This study is probably normal with no evidence of significant myocardial ischemia. There is fixed attenuation in the anterior and anterolateral wall that is likely due to breast attenuation althougha prior injury cannot be excluded. ???Left ventricular systolic function is borderline normal. There are no high risk prognostic indicators present. ???The ECG portion of the study is negative for ischemia. Comparison is made with a prior study completed 201`4. ???Ejection fraction was 52%. ???There are no significant changes. The anterior attenuation that is seen on the current image was present on the prior study. In aggregate the current study is low risk in regards to predicted annual cardiovascular mortality rate.    Problems Addressed Today  Hypertension.  Hypercholesterolemia.  Paroxysmal atrial fibrillation.  Assessment and Plan     Sandra Parks???reports that her blood pressure is well controlled when she checks it at home. ???She now appears to  have a good antihypertensive regimen which is not causing symptomatic orthostasis.???Sandra Parks has paroxysmal atrial fibrillation. ???Her CHA2DS2-VASc score for stroke and systemic embolization risk is now 4, namely for gender, age, hypertension and diabetes mellitus. ???She also had a complication of pulmonary embolism following orthopedic surgery in 2013 and???in 2008???had recurrent pulmonary embolism when she held her warfarin in the perioperative period for sinus surgery in 2018. The patient understands that anticoagulation is used to decrease thrombotic or clotting complications such as stroke, systemic embolization and pulmonary embolization which can be disabling or fatal, but can, on occasion, lead to life-threatening or disabling bleeding complications including gastrointestinal and intracranial hemorrhage.???Anticoagulation options were presented to the patient which included warfarin or one of the newer direct acting oral anticoagulants and the patient wanted to???continue???warfarin. I have asked Sandra Parks to continue working with the cardiovascular nurses so that her INR can be monitored and her dose of warfarin adjusted accordingly.  Sandra Parks is adamant that she no longer has suicidal ideation or thoughts and indicates that she has been compliant with taking her medications as prescribed.  She understands that taking her medications incorrectly can have life-threatening/fatal consequences.  I have asked Sandra Parks to continue working with her primary care physician for control of her diabetes mellitus.???I have asked???Sandra Parks???to return for follow-up in???3???months. The time spent conducting patient evaluation, surveillance and education during this electronic visit provided through [employer] was 30 minutes.           Current Medications (including today's revisions)  ??? carvedilol (COREG) 12.5 mg tablet TAKE 1 TABLET BY MOUTH TWICE DAILY

## 2019-02-27 ENCOUNTER — Encounter: Admit: 2019-02-27 | Discharge: 2019-02-27 | Payer: MEDICARE

## 2019-03-16 ENCOUNTER — Encounter: Admit: 2019-03-16 | Discharge: 2019-03-16 | Payer: MEDICARE

## 2019-04-02 ENCOUNTER — Encounter: Admit: 2019-04-02 | Discharge: 2019-04-02 | Payer: MEDICARE

## 2019-04-02 DIAGNOSIS — Z7901 Long term (current) use of anticoagulants: Principal | ICD-10-CM

## 2019-04-02 DIAGNOSIS — Z8679 Personal history of other diseases of the circulatory system: ICD-10-CM

## 2019-04-02 DIAGNOSIS — I480 Paroxysmal atrial fibrillation: ICD-10-CM

## 2019-04-02 LAB — PROTIME INR (PT): Lab: 2.2

## 2019-04-28 ENCOUNTER — Encounter: Admit: 2019-04-28 | Discharge: 2019-04-28

## 2019-04-28 DIAGNOSIS — Z7901 Long term (current) use of anticoagulants: Secondary | ICD-10-CM

## 2019-04-28 DIAGNOSIS — I480 Paroxysmal atrial fibrillation: Secondary | ICD-10-CM

## 2019-04-28 DIAGNOSIS — Z8679 Personal history of other diseases of the circulatory system: Secondary | ICD-10-CM

## 2019-05-03 ENCOUNTER — Encounter: Admit: 2019-05-03 | Discharge: 2019-05-03

## 2019-05-04 MED ORDER — WARFARIN 6 MG PO TAB
ORAL_TABLET | Freq: Every day | ORAL | 3 refills | 90.00000 days | Status: AC
Start: 2019-05-04 — End: ?

## 2019-05-04 MED ORDER — TRIAMTERENE-HYDROCHLOROTHIAZID 37.5-25 MG PO TAB
ORAL_TABLET | Freq: Every day | ORAL | 3 refills | Status: AC
Start: 2019-05-04 — End: ?

## 2019-05-04 MED ORDER — CARVEDILOL 12.5 MG PO TAB
ORAL_TABLET | Freq: Two times a day (BID) | ORAL | 3 refills | 90.00000 days | Status: DC
Start: 2019-05-04 — End: 2020-06-08

## 2019-05-12 ENCOUNTER — Encounter: Admit: 2019-05-12 | Discharge: 2019-05-12

## 2019-05-12 NOTE — Progress Notes
Sent my chart reminder to pt.    ----- Message -----  From: Betsy Pries, RN  Sent: 05/05/2019  To: Sandra Parks  Subject: INR Due 05/05/19                                  Sandra Parks is due for an INR test on 05/05/19.

## 2019-05-17 ENCOUNTER — Encounter: Admit: 2019-05-17 | Discharge: 2019-05-17

## 2019-05-18 MED ORDER — WARFARIN 2 MG PO TAB
ORAL_TABLET | Freq: Every day | ORAL | 3 refills | 90.00000 days | Status: DC
Start: 2019-05-18 — End: 2019-07-28

## 2019-05-19 ENCOUNTER — Encounter: Admit: 2019-05-19 | Discharge: 2019-05-19

## 2019-05-19 DIAGNOSIS — Z8679 Personal history of other diseases of the circulatory system: Secondary | ICD-10-CM

## 2019-05-19 DIAGNOSIS — Z7901 Long term (current) use of anticoagulants: Secondary | ICD-10-CM

## 2019-05-19 DIAGNOSIS — I480 Paroxysmal atrial fibrillation: Secondary | ICD-10-CM

## 2019-05-19 LAB — LIPID PROFILE
Lab: 124
Lab: 155
Lab: 25
Lab: 37 — ABNORMAL LOW (ref >40)
Lab: 4
Lab: 93

## 2019-05-19 LAB — ALT (SGPT): Lab: 15

## 2019-06-02 ENCOUNTER — Encounter: Admit: 2019-06-02 | Discharge: 2019-06-02

## 2019-06-02 DIAGNOSIS — E785 Hyperlipidemia, unspecified: Secondary | ICD-10-CM

## 2019-06-09 ENCOUNTER — Ambulatory Visit: Admit: 2019-06-09 | Discharge: 2019-06-10

## 2019-06-09 ENCOUNTER — Encounter: Admit: 2019-06-09 | Discharge: 2019-06-09

## 2019-06-09 DIAGNOSIS — E119 Type 2 diabetes mellitus without complications: Secondary | ICD-10-CM

## 2019-06-09 DIAGNOSIS — I471 Supraventricular tachycardia: Secondary | ICD-10-CM

## 2019-06-09 DIAGNOSIS — Z7901 Long term (current) use of anticoagulants: Secondary | ICD-10-CM

## 2019-06-09 DIAGNOSIS — I480 Paroxysmal atrial fibrillation: Secondary | ICD-10-CM

## 2019-06-09 DIAGNOSIS — E538 Deficiency of other specified B group vitamins: Secondary | ICD-10-CM

## 2019-06-09 DIAGNOSIS — I10 Essential (primary) hypertension: Secondary | ICD-10-CM

## 2019-06-09 DIAGNOSIS — D509 Iron deficiency anemia, unspecified: Secondary | ICD-10-CM

## 2019-06-09 DIAGNOSIS — E785 Hyperlipidemia, unspecified: Secondary | ICD-10-CM

## 2019-06-09 DIAGNOSIS — I4892 Unspecified atrial flutter: Secondary | ICD-10-CM

## 2019-06-09 NOTE — Progress Notes
Obtained patient's verbal consent to treat them and their agreement to [facility] financial policy and NPP via this telehealth visit during the [facility] Emergency    Date of Service: 06/09/2019    Sandra Parks is a 73 y.o. female.       HPI     This encounter was conducted over the telephone where I could speak with the patient directly. Sandra Parks is followed for paroxysmal atrial flutter/fibrillation, hypertension, and supraventricular tachycardia. ???She is also followed for hyperlipidemia and diabetes mellitus. Sandra Parks has been receiving therapy her depression which she indicates is well controlled.  The patient indicates that she has been very compliant taking her medications as directed.???Sandra Parks reports chronic back pain which limits her activity. ???Otherwise, from a cardiovascular perspective, the patient has been stable over the past 3???months???and???she???reports no angina, congestive symptoms, palpitations, or sensation of sustained forceful heart pounding. Her exercise tolerance has been stable but???is???limited by???chronic low back discomfort and???left knee arthritis.  She can walk approximately a block at a time at the most.  The patient reports no myalgias, bleeding abnormalities, neurologic motor abnormalities or difficulty with speech.  Sandra Parks reports no recent falls.  She reports no febrile symptoms or infections.  She reports that her blood pressure is been well controlled and her last blood pressure taken this morning was 127/60 mmHg with a pulse of 59 bpm.  Her blood sugar was 126 mg/dL yesterday morning and 119 mg/dL last night.  ???  Historically, in February 2004, she underwent radiofrequency ablation for atrial flutter, although this appeared to be only 1 of the supraventricular arrhythmias that she was having. She has been on Coumadin because her CHA2DS2-VASc score for stroke and systemic embolization risk is now 4, namely for gender, age, hypertension and diabetes mellitus. In December 2012 warfarin was stopped 4-5 days prior to surgery to repair her left rotator cuff. The patient reports that surgery was uneventful. Warfarin was resumed after surgery but stopped, once again, 4-5 days prior to surgery to repair her right rotator cuff on 01/03/12. On 01/06/12 she became short of breath and on 01/07/12 she was admitted to [facility] with a diagnosis of pulmonary embolism. Sandra Parks was treated with unfractionated heparin and then warfarin was resumed. 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 She was hospitalized from 12/09/13 thru 12/11/13 for sinusitis but also reports treatment for bronchitis and pneumonia. She required treatment with steroids which made her blood pressure and blood sugar difficult to control. Sandra Parks reports that she was hospitalized again for approximately 4 days in February 2015 for a sinus infection requiring antibiotic therapy. The patient reports that she had her sinuses irrigated as an outpatient in March 2015 and has had no recurrent sinus infections. The patient fell on 01/19/15 and suffered a comminuted fracture of the distal left radius. She reports that she did not require surgery and was place in a protective sling. She reports one other fall.??????Her falls were associated with???lightheaded which occurred???with standing for 15 minutes.???Due to her orthostatic symptoms, her hydralazine was discontinued and her carvedilol was reduced to 12.5 mg twice daily from 25 mg twice daily. She felt much better after this dose reduction.???The patient reports having a persistent sinus infection over the wintertime 2017/2018???associated with ocular migraines. She stopped her warfarin in preparation for sinus surgery and underwent sinus surgery the week of July 08, 2017. 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 ???She was hospitalized until 07/22/2017. ???Her warfarin was restarted. ???On 08/01/2017 she had a syncopal episode shortly after urinating and then standing. She felt lightheaded prior to losing consciousness and she reports that she fractured her left hand.??????This was repaired surgically in late September 2018. ???This time she received anticoagulation bridging when her warfarin was stopped in preparation for orthopedic surgery and she reports no complications from surgery.???Sandra Parks was seen in the emergency department at [facility] hospital???on 10/21/2018 following an overdose of medication.        Vitals:    06/09/19 1009   Weight: 127 kg (280 lb)   Height: 1.753 m (5' 9)   PainSc: Zero     Body mass index is 41.35 kg/m???.     Past Medical History  Patient Active Problem List    Diagnosis Date Noted   ??? History of pulmonary embolism 05/28/2012   ??? Syncope 01/14/2012   ??? History of atrial flutter 01/14/2012     2004: RFA     ??? AKI (acute kidney injury) (HCC) 01/14/2012   ??? Chronic anticoagulation 01/14/2012   ??? Iron deficiency anemia 01/13/2012   ??? B12 deficiency 01/13/2012   ??? 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 Hypertension 07/12/2009   ??? Supraventricular tachycardia (HCC) 07/12/2009   ??? Hyperlipidemia 07/12/2009   ??? Type II diabetes mellitus (HCC) 07/12/2009   ??? PAF (paroxysmal atrial fibrillation) (HCC) 04/12/2009         Review of Systems   Constitution: Negative.   HENT: Negative.    Eyes: Negative.    Cardiovascular: Negative.    Respiratory: Positive for cough (asthma).    Endocrine: Negative.    Hematologic/Lymphatic: Negative.    Skin: Positive for dry skin and itching.   Musculoskeletal: Negative.    Gastrointestinal: Negative.    Genitourinary: Negative.    Neurological: Negative.    Psychiatric/Behavioral: Negative.    Allergic/Immunologic: Negative.        Physical Exam  A comprehensive examination could not be performed because this clinician patient interaction was conducted over the telephone    Cardiovascular Studies  Labs from 10/23/2018 revealed serum potassium 4.4 mmol/L and serum creatinine 1.08 mg/dL.  Her serum glucose was 108 mg/dL.  Labs from 05/19/2019 revealed total cholesterol 155, triglycerides 124, HDL 37 and LDL cholesterol 93 mg/dL.  Her ALT = 15.  A twelve-lead ECG was obtained on 11/18/2018 and shows normal sinus rhythm with a heart rate of 57 bpm.  ???  Her INR on 05/19/2019 was therapeutic at 2.1.  ???  An echo Doppler study obtained on 07/02/2017 revealed:  1. No regional wall motion abnormalities are seen. Overall LV systolic function appears normal. The estimated left ventricular ejection fraction is 55-60%. Left ventricular contractility appears similar when compared with the prior echocardiogram performed on 04/22/13.  2. Right ventricular contractility appears normal.  3. Severe left atrial enlargement. ???Mild right atrial enlargement  4. Mild mitral valve regurgitation.  5. No pericardial effusion is seen.  6. The estimated peak systolic PA pressure equals 44 mmHg.  ???  A regadenoson thallium stress study obtained on 07/05/2017 revealed:???  Regional Wall Thickening and Motion Post Stress: ??????There is borderline normal left ventricular wall motion and thickening of all myocardial segments. ???Left Ventricular Ejection Fraction (post stress, in the resting state) =??????49 %. Left Ventricular End Diastolic Volume: 161 mL. ???  SUMMARY/OPINION:??????This study is probably normal with no evidence of significant myocardial ischemia. There is fixed attenuation in the anterior and  anterolateral wall that is likely due to breast attenuation althougha prior injury cannot be excluded. ???Left ventricular systolic function is borderline normal. There are no high risk prognostic indicators present. ???The ECG portion of the study is negative for ischemia. Comparison is made with a prior study completed 201`4. ???Ejection fraction was 52%. ???There are no significant changes. The anterior attenuation that is seen on the current image was present on the prior study. In aggregate the current study is low risk in regards to predicted annual cardiovascular mortality rate.  ???  Problems Addressed Today  Hypertension. ???Hypercholesterolemia. ???Paroxysmal atrial fibrillation.    Assessment and Plan     Ms. Kuzmenko???reports that her blood pressure is well controlled when she checks it at home. ???She now appears to have a good antihypertensive regimen which is not causing symptomatic orthostasis.???Ms. Kraner has paroxysmal atrial fibrillation.  He reports no symptomatic recurrences over the past 3 months. ???Her CHA2DS2-VASc score for stroke and systemic embolization risk is now 4, namely for gender, age, hypertension and diabetes mellitus. ???She also had a complication of pulmonary embolism following orthopedic surgery in 2013 and???in 2008???had recurrent pulmonary embolism when she held her warfarin in the perioperative period for sinus surgery in 2018. The patient understands that anticoagulation is used to decrease thrombotic or clotting complications such as stroke, systemic embolization and pulmonary embolization which can be disabling or fatal, but can, on occasion, lead to life-threatening or disabling bleeding complications including gastrointestinal and intracranial hemorrhage.???Anticoagulation options were presented to the patient which included warfarin or one of the newer direct acting oral anticoagulants and the patient wanted to???continue???warfarin. I have asked Ms. Meikle to continue working with the cardiovascular nurses so that her INR can be monitored and her dose of warfarin adjusted accordingly.??????Ms. Hudspeth is adamant that she no longer has suicidal ideation or thoughts and indicates that she has been compliant with taking her medications as prescribed. ???She understands that taking her medications incorrectly can have life-threatening/fatal consequences. ???I have asked Ms. Drach to continue working with her primary care physician for control of her diabetes mellitus.???I have asked???Ms. Welle???to return for face-to-face follow-up in 6???months.  She was given a requisition to obtain a Chem-7. The time spent conducting patient evaluation, surveillance and education during this electronic visit provided through [employer] was 30 minutes.         Current Medications (including today's revisions)  ??? carvediloL (COREG) 12.5 mg tablet TAKE 1 TABLET BY MOUTH TWICE DAILY (MUST  MAKE  OFFICE  VISIT  FOR  ADDITIONAL  REFILLS)   ??? glimepiride (AMARYL) 2 mg PO tablet Take 2 mg by mouth twice daily.   ??? insulin degludec 100 unit/mL (3 mL) inpn Inject 50 Units under the skin at bedtime daily.   ??? lisinopril (PRINIVIL; ZESTRIL) 20 mg tablet Take 20 mg by mouth daily.   ??? multivitamin (MULTIPLE VITAMIN) per tablet Take 1 Tab by mouth Daily.   ??? omeprazole (PRILOSEC) 40 mg capsule Take 40 mg by mouth Daily.   ??? potassium chloride (KLOR-CON) 20 mEq packet Take 20 mEq by mouth daily.   ??? rosuvastatin (CRESTOR) 20 mg tablet Take 1 tablet by mouth once daily   ??? Saxagliptin 2.5 mg tab Take 1 tablet by mouth every morning.   ??? sertraline (ZOLOFT) 100 mg tablet Take 100 mg by mouth at bedtime daily.   ??? triamterene-hydrochlorothiazide (MAXZIDE) 37.5-25 mg tablet Take 1 tablet by mouth once daily   ??? warfarin (COUMADIN) 2 mg tablet TAKE 1  TABLET BY MOUTH ONCE DAILY AS DIRECTED   ??? warfarin (COUMADIN) 6 mg tablet TAKE 1 TABLET BY MOUTH ONCE DAILY AS DIRECTED

## 2019-06-24 ENCOUNTER — Encounter: Admit: 2019-06-24 | Discharge: 2019-06-24

## 2019-06-24 NOTE — Progress Notes
Sent pt reminder via my chart.    ----- Message -----  From: Asencion Noble  Sent: 06/16/2019  To: Cvm Nurse [place]  Subject: INR Due 06/16/19                                  Sandra Parks is due for an INR test on 06/16/19.

## 2019-07-07 LAB — PROTIME INR (PT): Lab: 2.2

## 2019-07-08 ENCOUNTER — Encounter: Admit: 2019-07-08 | Discharge: 2019-07-08

## 2019-07-08 DIAGNOSIS — I480 Paroxysmal atrial fibrillation: Secondary | ICD-10-CM

## 2019-07-28 ENCOUNTER — Encounter: Admit: 2019-07-28 | Discharge: 2019-07-28

## 2019-07-28 MED ORDER — WARFARIN 2 MG PO TAB
ORAL_TABLET | Freq: Every day | ORAL | 3 refills | 90.00000 days | Status: AC
Start: 2019-07-28 — End: ?

## 2019-08-19 ENCOUNTER — Encounter: Admit: 2019-08-19 | Discharge: 2019-08-19 | Payer: MEDICARE

## 2019-09-29 ENCOUNTER — Encounter: Admit: 2019-09-29 | Discharge: 2019-09-29 | Payer: MEDICARE

## 2019-09-29 DIAGNOSIS — I480 Paroxysmal atrial fibrillation: Secondary | ICD-10-CM

## 2019-09-29 DIAGNOSIS — Z8679 Personal history of other diseases of the circulatory system: Secondary | ICD-10-CM

## 2019-09-29 DIAGNOSIS — Z7901 Long term (current) use of anticoagulants: Secondary | ICD-10-CM

## 2019-11-03 ENCOUNTER — Encounter: Admit: 2019-11-03 | Discharge: 2019-11-03 | Payer: MEDICARE

## 2019-11-03 DIAGNOSIS — I480 Paroxysmal atrial fibrillation: Secondary | ICD-10-CM

## 2019-11-03 DIAGNOSIS — Z7901 Long term (current) use of anticoagulants: Secondary | ICD-10-CM

## 2019-11-03 DIAGNOSIS — Z8679 Personal history of other diseases of the circulatory system: Secondary | ICD-10-CM

## 2019-12-08 ENCOUNTER — Encounter: Admit: 2019-12-08 | Discharge: 2019-12-08 | Payer: MEDICARE

## 2019-12-08 DIAGNOSIS — E785 Hyperlipidemia, unspecified: Secondary | ICD-10-CM

## 2019-12-08 DIAGNOSIS — I480 Paroxysmal atrial fibrillation: Secondary | ICD-10-CM

## 2019-12-08 DIAGNOSIS — Z7901 Long term (current) use of anticoagulants: Secondary | ICD-10-CM

## 2019-12-08 DIAGNOSIS — Z8679 Personal history of other diseases of the circulatory system: Secondary | ICD-10-CM

## 2019-12-08 DIAGNOSIS — E119 Type 2 diabetes mellitus without complications: Secondary | ICD-10-CM

## 2019-12-08 DIAGNOSIS — I471 Supraventricular tachycardia: Secondary | ICD-10-CM

## 2019-12-08 DIAGNOSIS — E538 Deficiency of other specified B group vitamins: Secondary | ICD-10-CM

## 2019-12-08 DIAGNOSIS — I4892 Unspecified atrial flutter: Secondary | ICD-10-CM

## 2019-12-08 DIAGNOSIS — E7800 Pure hypercholesterolemia, unspecified: Secondary | ICD-10-CM

## 2019-12-08 DIAGNOSIS — D509 Iron deficiency anemia, unspecified: Secondary | ICD-10-CM

## 2019-12-08 DIAGNOSIS — I10 Essential (primary) hypertension: Secondary | ICD-10-CM

## 2019-12-08 NOTE — Progress Notes
Date of Service: 12/08/2019    Sandra Parks is a 74 y.o. female.       HPI     Sandra Parks is followed for paroxysmal atrial flutter/fibrillation, hypertension, and supraventricular tachycardia. ?She is also followed for hyperlipidemia and diabetes mellitus.  Her son died unexpectedly within the past month and she is going through an appropriate grief response. ?The patient indicates that she has been very compliant taking her medications as directed.?Sandra Parks reports chronic back pain which limits her activity. ?Otherwise, from a cardiovascular perspective, the patient has been stable over the past 6?months?and?she?reports no angina, congestive symptoms, palpitations, or sensation of sustained forceful heart pounding. Her exercise tolerance has been stable but?is?limited by?chronic low back discomfort and?left knee arthritis.  She can walk approximately a block at a time at the most.  The patient reports no myalgias, bleeding abnormalities, neurologic motor abnormalities or difficulty with speech.  Sandra Parks reports no recent falls.  She reports no febrile symptoms or infections during the Covid pandemic.  She reports that her blood pressure has been well controlled in recent months. Historically, in February 2004, she underwent radiofrequency ablation for atrial flutter, although this appeared to be only 1 of the supraventricular arrhythmias that she was having. She has been on Coumadin because her CHA2DS2-VASc score for stroke and systemic embolization risk is now 4, namely for gender, age, hypertension and diabetes mellitus. In December 2012 warfarin was stopped 4-5 days prior to surgery to repair her left rotator cuff. The patient reports that surgery was uneventful. Warfarin was resumed after surgery but stopped, once again, 4-5 days prior to surgery to repair her right rotator cuff on 01/03/12. On 01/06/12 she became short of breath and on 01/07/12 she was admitted to [facility] with a diagnosis of pulmonary embolism. Sandra Parks was treated with unfractionated heparin and then warfarin was resumed. 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 She was hospitalized from 12/09/13 thru 12/11/13 for sinusitis but also reports treatment for bronchitis and pneumonia. She required treatment with steroids which made her blood pressure and blood sugar difficult to control. Sandra Parks reports that she was hospitalized again for approximately 4 days in February 2015 for a sinus infection requiring antibiotic therapy. The patient reports that she had her sinuses irrigated as an outpatient in March 2015 and has had no recurrent sinus infections. The patient fell on 01/19/15 and suffered a comminuted fracture of the distal left radius. She reports that she did not require surgery and was place in a protective sling. She reports one other fall.??Her falls were associated with?lightheaded which occurred?with standing for 15 minutes.?Due to her orthostatic symptoms, her hydralazine was discontinued and her carvedilol was reduced to 12.5 mg twice daily from 25 mg twice daily. She felt much better after this dose reduction.?The patient reports having a persistent sinus infection over the wintertime 2017/2018?associated with ocular migraines. She stopped her warfarin in preparation for sinus surgery and underwent sinus surgery the week of July 08, 2017. 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 ?She was hospitalized until 07/22/2017. ?Her warfarin was restarted. ?On 08/01/2017 she had a syncopal episode shortly after urinating and then standing. She felt lightheaded prior to losing consciousness and she reports that she fractured her left hand.??This was repaired surgically in late September 2018. ?This time she received anticoagulation bridging when her warfarin was stopped in preparation for orthopedic surgery and she reports no complications from  surgery.?Sandra Parks?was seen in the emergency department at [facility] hospital?on 10/21/2018 following an overdose of medication.          Vitals:    12/08/19 1312 12/08/19 1322   BP: 128/68 132/72   BP Source: Arm, Left Upper Arm, Right Upper   Patient Position: Sitting Sitting   Pulse: 60    Temp: 36.8 ?C (98.2 ?F)    TempSrc: Oral    SpO2: 96%    Weight: 131.5 kg (290 lb)    Height: 1.753 m (5' 9)    PainSc: Zero      Body mass index is 42.83 kg/m?Marland Kitchen     Past Medical History  Patient Active Problem List    Diagnosis Date Noted   ? History of pulmonary embolism 05/28/2012   ? Syncope 01/14/2012   ? History of atrial flutter 01/14/2012     2004: RFA     ? AKI (acute kidney injury) (HCC) 01/14/2012   ? Chronic anticoagulation 01/14/2012   ? Iron deficiency anemia 01/13/2012   ? B12 deficiency 01/13/2012   ? Bilateral pulmonary embolism (HCC) 01/08/2012   ? Hypertension 07/12/2009   ? Supraventricular tachycardia (HCC) 07/12/2009   ? Hyperlipidemia 07/12/2009   ? Type II diabetes mellitus (HCC) 07/12/2009   ? PAF (paroxysmal atrial fibrillation) (HCC) 04/12/2009         Review of Systems   Constitution: Negative.   HENT: Negative.    Eyes: Negative. Cardiovascular: Positive for dyspnea on exertion and leg swelling.   Endocrine: Negative.    Hematologic/Lymphatic: Negative.    Skin: Negative.    Musculoskeletal: Negative.    Gastrointestinal: Negative.    Genitourinary: Negative.    Neurological: Negative.    Psychiatric/Behavioral: Negative.    Allergic/Immunologic: Negative.        Physical Exam  GENERAL: The patient is well developed, well nourished, resting comfortably and in no distress. ?  HEENT: No abnormalities of the visible oro-nasopharynx, conjunctiva or sclera are noted.  NECK: There is no jugular venous distension. Carotids are palpable and without bruits. There is no thyroid enlargement.  Chest: Lung fields are clear to auscultation. There are no wheezes or crackles.  CV: There is a regular rhythm. The first and second heart sounds are normal. There are no murmurs, gallops or rubs. Her apical heart rate is?60?BPM.  ABD: The abdomen is soft and supple with normal bowel sounds. There is no hepatosplenomegaly, ascites, tenderness, masses or bruits.  Neuro: There are no focal motor defects. Ambulation is normal. Cognitive function appears normal.  Ext:?There is trace bipedal edema without evidence of deep vein thrombosis. She does have 1+ bilateral lower leg edema. ?Peripheral pulses are satisfactory. ?She has fairly good mobility in her left wrist and left hand.  SKIN:?There are no rashes and no cellulitis  PSYCH:?The patient is calm, rationale and oriented.    Cardiovascular Studies  A twelve-lead ECG was obtained on 12/08/2019 reveals normal sinus rhythm with a heart rate of 58 bpm.  There is no evidence of myocardial ischemia or infarction.  Labs from 12/07/2019 reveals hemoglobin 12.7 g/dL.  Her INR was 2.3.  Her serum potassium was 3.8 mmol/L and serum creatinine 1.25 mg/dL.  Her hemoglobin A1c was 8.0%.  Her ALT = 12.  Her total cholesterol was 138, triglycerides 205, HDL 34 and LDL cholesterol 63 mg/dL. An echo Doppler study obtained on 07/02/2017 revealed:  1. No regional wall motion abnormalities are seen. Overall LV systolic function appears normal. The estimated left  ventricular ejection fraction is 55-60%. Left ventricular contractility appears similar when compared with the prior echocardiogram performed on 04/22/13.  2. Right ventricular contractility appears normal.  3. Severe left atrial enlargement. ?Mild right atrial enlargement  4. Mild mitral valve regurgitation.  5. No pericardial effusion is seen.  6. The estimated peak systolic PA pressure equals 44 mmHg.  ?  A regadenoson thallium stress study obtained on 07/05/2017 revealed:?  Regional Wall Thickening and Motion Post Stress: ??There is borderline normal left ventricular wall motion and thickening of all myocardial segments. ?Left Ventricular Ejection Fraction (post stress, in the resting state) =??49 %. Left Ventricular End Diastolic Volume: 161 mL. ?  SUMMARY/OPINION:??This study is probably normal with no evidence of significant myocardial ischemia. There is fixed attenuation in the anterior and anterolateral wall that is likely due to breast attenuation althougha prior injury cannot be excluded. ?Left ventricular systolic function is borderline normal. There are no high risk prognostic indicators present. ?The ECG portion of the study is negative for ischemia. Comparison is made with a prior study completed 201`4. ?Ejection fraction was 52%. ?There are no significant changes. The anterior attenuation that is seen on the current image was present on the prior study. In aggregate the current study is low risk in regards to predicted annual cardiovascular mortality rate.    Problems Addressed Today  Paroxysmal atrial fibrillation.  Hypertension.  Hypercholesterolemia.  Assessment and Plan Sandra Parks?reports that her blood pressure is well controlled when she checks it at home. ?She now appears to have a good antihypertensive regimen which is not causing symptomatic orthostasis.?Sandra Parks has paroxysmal atrial fibrillation.  He reports no symptomatic recurrences over the past 6 months. ?Her CHA2DS2-VASc score for stroke and systemic embolization risk is now 4, namely for gender, age, hypertension and diabetes mellitus. ?She also had a complication of pulmonary embolism following orthopedic surgery in 2013 and?in 2008?had recurrent pulmonary embolism when she held her warfarin in the perioperative period for sinus surgery in 2018. The patient understands that anticoagulation is used to decrease thrombotic or clotting complications such as stroke, systemic embolization and pulmonary embolization which can be disabling or fatal, but can, on occasion, lead to life-threatening or disabling bleeding complications including gastrointestinal and intracranial hemorrhage.?Anticoagulation options were presented to the patient which included warfarin or one of the newer direct acting oral anticoagulants and the patient wanted to?continue?warfarin. I have asked Sandra Parks to continue working with the cardiovascular nurses so that her INR can be monitored and her dose of warfarin adjusted accordingly.?I have asked Sandra Parks to continue working with her primary care physician for control of her diabetes mellitus.?I have asked?Sandra Parks?to return for follow-up in 6?months.         Current Medications (including today's revisions)  ? carvediloL (COREG) 12.5 mg tablet TAKE 1 TABLET BY MOUTH TWICE DAILY (MUST  MAKE  OFFICE  VISIT  FOR  ADDITIONAL  REFILLS)   ? glimepiride (AMARYL) 2 mg PO tablet Take 2 mg by mouth twice daily.   ? insulin degludec 100 unit/mL (3 mL) inpn Inject 40 Units under the skin at bedtime daily.   ? lisinopril (PRINIVIL; ZESTRIL) 20 mg tablet Take 20 mg by mouth daily. ? multivitamin (MULTIPLE VITAMIN) per tablet Take 1 Tab by mouth Daily.   ? omeprazole (PRILOSEC) 40 mg capsule Take 40 mg by mouth Daily.   ? omeprazole DR (PRILOSEC) 10 mg capsule Take 10 mg by mouth daily before breakfast.   ? potassium chloride (KLOR-CON) 20 mEq  packet Take 20 mEq by mouth daily.   ? rosuvastatin (CRESTOR) 20 mg tablet Take 1 tablet by mouth once daily   ? Saxagliptin 2.5 mg tab Take 1 tablet by mouth every morning.   ? sertraline (ZOLOFT) 100 mg tablet Take 100 mg by mouth at bedtime daily.   ? triamterene-hydrochlorothiazide (MAXZIDE) 37.5-25 mg tablet Take 1 tablet by mouth once daily   ? warfarin (COUMADIN) 2 mg tablet Take 2 tablets by mouth daily as directed for INR.   ? warfarin (COUMADIN) 6 mg tablet TAKE 1 TABLET BY MOUTH ONCE DAILY AS DIRECTED

## 2020-01-15 ENCOUNTER — Encounter: Admit: 2020-01-15 | Discharge: 2020-01-15 | Payer: MEDICARE

## 2020-01-15 DIAGNOSIS — Z7901 Long term (current) use of anticoagulants: Secondary | ICD-10-CM

## 2020-01-15 DIAGNOSIS — Z8679 Personal history of other diseases of the circulatory system: Secondary | ICD-10-CM

## 2020-01-15 DIAGNOSIS — I480 Paroxysmal atrial fibrillation: Secondary | ICD-10-CM

## 2020-01-15 LAB — PROTIME INR (PT): Lab: 2.2

## 2020-01-15 NOTE — Telephone Encounter
INR Overdue. Called and left message requesting pt to have INR drawn at earliest convenience.  Left callback number for any questions or concerns.

## 2020-02-19 ENCOUNTER — Encounter: Admit: 2020-02-19 | Discharge: 2020-02-19 | Payer: MEDICARE

## 2020-02-19 NOTE — Telephone Encounter
INR Overdue.  Called and left message requesting pt to have INR drawn at earliest convenience.  Left callback number for any questions or concerns.

## 2020-02-26 ENCOUNTER — Encounter: Admit: 2020-02-26 | Discharge: 2020-02-26 | Payer: MEDICARE

## 2020-02-26 MED ORDER — ROSUVASTATIN 20 MG PO TAB
ORAL_TABLET | Freq: Every day | ORAL | 3 refills | 90.00000 days | Status: AC
Start: 2020-02-26 — End: ?

## 2020-03-04 ENCOUNTER — Encounter: Admit: 2020-03-04 | Discharge: 2020-03-04 | Payer: MEDICARE

## 2020-03-04 DIAGNOSIS — I480 Paroxysmal atrial fibrillation: Secondary | ICD-10-CM

## 2020-03-04 DIAGNOSIS — Z8679 Personal history of other diseases of the circulatory system: Secondary | ICD-10-CM

## 2020-03-04 DIAGNOSIS — Z7901 Long term (current) use of anticoagulants: Secondary | ICD-10-CM

## 2020-03-04 LAB — PROTIME INR (PT): Lab: 2.7

## 2020-04-07 ENCOUNTER — Encounter: Admit: 2020-04-07 | Discharge: 2020-04-07 | Payer: MEDICARE

## 2020-04-07 DIAGNOSIS — I480 Paroxysmal atrial fibrillation: Secondary | ICD-10-CM

## 2020-04-07 LAB — PROTIME INR (PT): Lab: 2.1 — ABNORMAL HIGH (ref 0.89–1.11)

## 2020-04-26 ENCOUNTER — Encounter: Admit: 2020-04-26 | Discharge: 2020-04-26 | Payer: MEDICARE

## 2020-04-26 DIAGNOSIS — I4892 Unspecified atrial flutter: Secondary | ICD-10-CM

## 2020-04-26 DIAGNOSIS — I480 Paroxysmal atrial fibrillation: Secondary | ICD-10-CM

## 2020-04-26 DIAGNOSIS — Z7901 Long term (current) use of anticoagulants: Secondary | ICD-10-CM

## 2020-04-26 DIAGNOSIS — I471 Supraventricular tachycardia: Secondary | ICD-10-CM

## 2020-04-26 DIAGNOSIS — I10 Essential (primary) hypertension: Secondary | ICD-10-CM

## 2020-04-26 DIAGNOSIS — E119 Type 2 diabetes mellitus without complications: Secondary | ICD-10-CM

## 2020-04-26 DIAGNOSIS — E538 Deficiency of other specified B group vitamins: Secondary | ICD-10-CM

## 2020-04-26 DIAGNOSIS — E785 Hyperlipidemia, unspecified: Secondary | ICD-10-CM

## 2020-04-26 DIAGNOSIS — D509 Iron deficiency anemia, unspecified: Secondary | ICD-10-CM

## 2020-04-26 LAB — PROTIME INR (PT): Lab: 2.2

## 2020-04-26 NOTE — Progress Notes
Date of Service: 04/26/2020    Sandra Parks is a 74 y.o. female.       HPI     Sandra Parks is followed for paroxysmal atrial flutter/fibrillation, hypertension, and supraventricular tachycardia. ?She is also followed for hyperlipidemia and diabetes mellitus. The patient indicates that she has been very compliant taking her medications as directed.?Sandra Parks reports chronic back pain which limits her activity.  She reports no febrile or infectious symptoms and has received both of her Covid vaccines. ?From a cardiovascular perspective, the patient has been stable over the past 5?months?and?she?reports no angina, congestive symptoms, palpitations, or sensation of sustained forceful heart pounding. Her exercise tolerance has been stable but?is?limited by?chronic low back discomfort and?left knee arthritis.??She can walk approximately a block or 2 at a time at the most. ?The patient reports no myalgias, bleeding abnormalities, claudication or strokelike symptoms.??Sandra Parks reports no recent falls. ?She reports that her blood pressure has been well controlled in recent months.  ?  Historically, in February 2004, she underwent radiofrequency ablation for atrial flutter, although this appeared to be only 1 of the supraventricular arrhythmias that she was having. She has been on Coumadin because her CHA2DS2-VASc score for stroke and systemic embolization risk is now 4, namely for gender, age, hypertension and diabetes mellitus. In December 2012 warfarin was stopped 4-5 days prior to surgery to repair her left rotator cuff. The patient reports that surgery was uneventful. Warfarin was resumed after surgery but stopped, once again, 4-5 days prior to surgery to repair her right rotator cuff on 01/03/12. On 01/06/12 she became short of breath and on 01/07/12 she was admitted to [facility] with a diagnosis of pulmonary embolism. Sandra Parks was treated with unfractionated heparin and then warfarin was resumed. 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 She was hospitalized from 12/09/13 thru 12/11/13 for sinusitis but also reports treatment for bronchitis and pneumonia. She required treatment with steroids which made her blood pressure and blood sugar difficult to control. Sandra Parks reports that she was hospitalized again for approximately 4 days in February 2015 for a sinus infection requiring antibiotic therapy. The patient reports that she had her sinuses irrigated as an outpatient in March 2015 and has had no recurrent sinus infections. The patient fell on 01/19/15 and suffered a comminuted fracture of the distal left radius. She reports that she did not require surgery and was place in a protective sling. She reports one other fall.??Her falls were associated with?lightheaded which occurred?with standing for 15 minutes.?Due to her orthostatic symptoms, her hydralazine was discontinued and her carvedilol was reduced to 12.5 mg twice daily from 25 mg twice daily. She felt much better after this dose reduction.?The patient reports having a persistent sinus infection over the wintertime 2017/2018?associated with ocular migraines. She stopped her warfarin in preparation for sinus surgery and underwent sinus surgery the week of July 08, 2017. 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 ?She was hospitalized until 07/22/2017. ?Her warfarin was restarted. ?On 08/01/2017 she had a syncopal episode shortly after urinating and then standing. She felt lightheaded prior to losing consciousness and she reports that she fractured her left hand.??This was repaired surgically in late September 2018. ?This time she received anticoagulation bridging when her warfarin was stopped in preparation for orthopedic surgery and she reports no complications from surgery.?Sandra Parks?was seen in the emergency department at [facility] hospital?on 10/21/2018 following an overdose of medication. Her son died  unexpectedly in December 2020.       Vitals:    04/26/20 1438 04/26/20 1451   BP: 128/78 132/76   BP Source: Arm, Left Upper Arm, Right Upper   Patient Position: Sitting Sitting   Pulse: 60    SpO2: 97%    Weight: 132.5 kg (292 lb)    Height: 1.753 m (5' 9)    PainSc: Zero      Body mass index is 43.12 kg/m?Marland Kitchen     Past Medical History  Patient Active Problem List    Diagnosis Date Noted   ? History of pulmonary embolism 05/28/2012   ? Syncope 01/14/2012   ? History of atrial flutter 01/14/2012     2004: RFA     ? AKI (acute kidney injury) (HCC) 01/14/2012   ? Chronic anticoagulation 01/14/2012   ? Iron deficiency anemia 01/13/2012   ? B12 deficiency 01/13/2012   ? Bilateral pulmonary embolism (HCC) 01/08/2012   ? Hypertension 07/12/2009   ? Supraventricular tachycardia (HCC) 07/12/2009   ? Hyperlipidemia 07/12/2009   ? Type II diabetes mellitus (HCC) 07/12/2009   ? PAF (paroxysmal atrial fibrillation) (HCC) 04/12/2009         Review of Systems   Constitution: Positive for malaise/fatigue.   HENT: Positive for tinnitus.    Eyes: Negative.    Cardiovascular: Positive for dyspnea on exertion, irregular heartbeat, leg swelling and palpitations.   Respiratory: Positive for cough.    Endocrine: Positive for polyuria.   Hematologic/Lymphatic: Bruises/bleeds easily.   Skin: Negative.    Musculoskeletal: Positive for arthritis, back pain, joint pain and neck pain.   Gastrointestinal: Positive for bowel incontinence.   Genitourinary: Positive for bladder incontinence and frequency.   Neurological: Positive for excessive daytime sleepiness, dizziness, numbness and paresthesias.   Psychiatric/Behavioral: Negative.    Allergic/Immunologic: Negative.        Physical Exam  GENERAL: The patient is well developed, well nourished, resting comfortably and in no distress. ?  HEENT: No abnormalities of the visible oro-nasopharynx, conjunctiva or sclera are noted.  NECK: There is no jugular venous distension. Carotids are palpable and without bruits. There is no thyroid enlargement.  Chest: Lung fields are clear to auscultation. There are no wheezes or crackles.  CV: There is a regular rhythm. The first and second heart sounds are normal. There are no murmurs, gallops or rubs. Her apical heart rate is?68?BPM.  ABD: The abdomen is soft and supple with normal bowel sounds. There is no hepatosplenomegaly, ascites, tenderness, masses or bruits.  Neuro: There are no focal motor defects. Ambulation is normal. Cognitive function appears normal.  Ext:?There is trace bipedal edema without evidence of deep vein thrombosis. She does have 1+ bilateral lower leg edema. ?Peripheral pulses are satisfactory. ?She has fairly good mobility in her left wrist and left hand.  SKIN:?There are no rashes and no cellulitis  PSYCH:?The patient is calm, rationale and oriented.    Cardiovascular Studies  A twelve-lead ECG was obtained on 12/08/2019 reveals normal sinus rhythm with a heart rate of 58 bpm.  There is no evidence of myocardial ischemia or infarction.  Labs from 12/07/2019 reveals hemoglobin 12.7 g/dL.  Her INR was 2.3.  Her serum potassium was 3.8 mmol/L and serum creatinine 1.25 mg/dL.  Her hemoglobin A1c was 8.0%.  Her ALT = 12.  Her total cholesterol was 138, triglycerides 205, HDL 34 and LDL cholesterol 63 mg/dL.  ?  An echo Doppler study obtained on 07/02/2017 revealed:  1. No regional  wall motion abnormalities are seen. Overall LV systolic function appears normal. The estimated left ventricular ejection fraction is 55-60%. Left ventricular contractility appears similar when compared with the prior echocardiogram performed on 04/22/13.  2. Right ventricular contractility appears normal.  3. Severe left atrial enlargement. ?Mild right atrial enlargement  4. Mild mitral valve regurgitation.  5. No pericardial effusion is seen.  6. The estimated peak systolic PA pressure equals 44 mmHg.  ?  A regadenoson thallium stress study obtained on 07/05/2017 revealed:?  Regional Wall Thickening and Motion Post Stress: ??There is borderline normal left ventricular wall motion and thickening of all myocardial segments. ?Left Ventricular Ejection Fraction (post stress, in the resting state) =??49 %. Left Ventricular End Diastolic Volume: 578 mL. ?  SUMMARY/OPINION:??This study is probably normal with no evidence of significant myocardial ischemia. There is fixed attenuation in the anterior and anterolateral wall that is likely due to breast attenuation althougha prior injury cannot be excluded. ?Left ventricular systolic function is borderline normal. There are no high risk prognostic indicators present. ?The ECG portion of the study is negative for ischemia. Comparison is made with a prior study completed 201`4. ?Ejection fraction was 52%. ?There are no significant changes. The anterior attenuation that is seen on the current image was present on the prior study. In aggregate the current study is low risk in regards to predicted annual cardiovascular mortality rate.    Problems Addressed Today  Paroxysmal atrial fibrillation/flutter.  Hypertension.  Diabetes mellitus.  Hypercholesterolemia  Assessment and Plan     Ms. Dattilo?reports that her blood pressure is well controlled when she checks it at home. ?She now appears to have a good antihypertensive regimen which is not causing symptomatic orthostasis.?Ms. Shelly has paroxysmal atrial fibrillation.??He reports no symptomatic recurrences over the past 6 months.??Her CHA2DS2-VASc score for stroke and systemic embolization risk is now 4, namely for gender, age, hypertension and diabetes mellitus. ?She also had a complication of pulmonary embolism following orthopedic surgery in 2013 and?in 2008?had recurrent pulmonary embolism when she held her warfarin in the perioperative period for sinus surgery in 2018. The patient understands that anticoagulation is used to decrease thrombotic or clotting complications such as stroke, systemic embolization and pulmonary embolization which can be disabling or fatal, but can, on occasion, lead to life-threatening or disabling bleeding complications including gastrointestinal and intracranial hemorrhage.?Anticoagulation options were presented to the patient which included warfarin or one of the newer direct acting oral anticoagulants and the patient wanted to?continue?warfarin. I have asked Ms. Quaderer to continue working with the cardiovascular nurses so that her INR can be monitored and her dose of warfarin adjusted accordingly.?I have asked Ms. Misener to continue working with her primary care physician for control of her diabetes mellitus.?I have asked?Ms. Fant?to return for follow-up in 6?months.         Current Medications (including today's revisions)  ? alendronate (FOSAMAX) 70 mg tablet Take 70 mg by mouth every 7 days.   ? calcium carbonate/vitamin D-3 (OSCAL-500+D) 1250 mg/200 unit tablet Take 1 tablet by mouth twice daily. Calcium Carb 1250mg  delivers 500mg  elemental Ca   ? carvediloL (COREG) 12.5 mg tablet TAKE 1 TABLET BY MOUTH TWICE DAILY (MUST  MAKE  OFFICE  VISIT  FOR  ADDITIONAL  REFILLS)   ? glimepiride (AMARYL) 2 mg PO tablet Take 2 mg by mouth twice daily.   ? LANTUS SOLOSTAR U-100 INSULIN 100 unit/mL (3 mL) injection PEN Inject 30 Units under the skin twice daily.   ? lisinopriL (ZESTRIL)  10 mg tablet Take 10 mg by mouth daily.   ? multivitamin (MULTIPLE VITAMIN) per tablet Take 1 Tab by mouth Daily.   ? pantoprazole DR (PROTONIX) 40 mg tablet Take 40 mg by mouth daily.   ? potassium chloride (KLOR-CON) 20 mEq packet Take 20 mEq by mouth daily.   ? rosuvastatin (CRESTOR) 20 mg tablet Take 1 tablet by mouth once daily   ? sertraline (ZOLOFT) 100 mg tablet Take 100 mg by mouth at bedtime daily.   ? triamterene-hydrochlorothiazide (MAXZIDE) 37.5-25 mg tablet Take 1 tablet by mouth once daily   ? warfarin (COUMADIN) 2 mg tablet Take 2 tablets by mouth daily as directed for INR.   ? warfarin (COUMADIN) 6 mg tablet TAKE 1 TABLET BY MOUTH ONCE DAILY AS DIRECTED

## 2020-06-03 ENCOUNTER — Encounter: Admit: 2020-06-03 | Discharge: 2020-06-03 | Payer: MEDICARE

## 2020-06-03 NOTE — Telephone Encounter
INR Overdue. Called and discussed with patient.  She will have INR drawn today.

## 2020-06-06 ENCOUNTER — Encounter: Admit: 2020-06-06 | Discharge: 2020-06-06 | Payer: MEDICARE

## 2020-06-06 DIAGNOSIS — I480 Paroxysmal atrial fibrillation: Secondary | ICD-10-CM

## 2020-06-06 LAB — PROTIME INR (PT): Lab: 2.3

## 2020-06-08 ENCOUNTER — Encounter: Admit: 2020-06-08 | Discharge: 2020-06-08 | Payer: MEDICARE

## 2020-06-08 MED ORDER — CARVEDILOL 12.5 MG PO TAB
12.5 mg | ORAL_TABLET | Freq: Two times a day (BID) | ORAL | 3 refills | 90.00000 days | Status: DC
Start: 2020-06-08 — End: 2020-06-10

## 2020-06-08 MED ORDER — CARVEDILOL 12.5 MG PO TAB
12.5 mg | ORAL_TABLET | Freq: Two times a day (BID) | ORAL | 3 refills | 90.00000 days | Status: DC
Start: 2020-06-08 — End: 2020-06-08

## 2020-06-10 ENCOUNTER — Encounter: Admit: 2020-06-10 | Discharge: 2020-06-10 | Payer: MEDICARE

## 2020-06-10 MED ORDER — CARVEDILOL 12.5 MG PO TAB
ORAL_TABLET | Freq: Two times a day (BID) | ORAL | 0 refills | 90.00000 days | Status: AC
Start: 2020-06-10 — End: ?

## 2020-06-29 ENCOUNTER — Encounter: Admit: 2020-06-29 | Discharge: 2020-06-29 | Payer: MEDICARE

## 2020-06-29 DIAGNOSIS — I480 Paroxysmal atrial fibrillation: Secondary | ICD-10-CM

## 2020-06-29 LAB — PROTIME INR (PT): Lab: 2.5

## 2020-07-22 ENCOUNTER — Encounter: Admit: 2020-07-22 | Discharge: 2020-07-22 | Payer: MEDICARE

## 2020-07-22 MED ORDER — TRIAMTERENE-HYDROCHLOROTHIAZID 37.5-25 MG PO TAB
ORAL_TABLET | Freq: Every day | ORAL | 3 refills | Status: AC
Start: 2020-07-22 — End: ?

## 2020-08-05 ENCOUNTER — Encounter: Admit: 2020-08-05 | Discharge: 2020-08-05 | Payer: MEDICARE

## 2020-08-05 MED ORDER — WARFARIN 2 MG PO TAB
ORAL_TABLET | Freq: Every day | ORAL | 3 refills | 90.00000 days | Status: AC
Start: 2020-08-05 — End: ?

## 2020-08-12 ENCOUNTER — Encounter: Admit: 2020-08-12 | Discharge: 2020-08-12 | Payer: MEDICARE

## 2020-08-26 ENCOUNTER — Encounter: Admit: 2020-08-26 | Discharge: 2020-08-26 | Payer: MEDICARE

## 2020-08-26 DIAGNOSIS — I480 Paroxysmal atrial fibrillation: Secondary | ICD-10-CM

## 2020-09-08 ENCOUNTER — Encounter: Admit: 2020-09-08 | Discharge: 2020-09-08 | Payer: MEDICARE

## 2020-09-08 NOTE — Progress Notes
Reminder sent via my chart.    ----- Message -----  From: Rogelia Boga, RN  Sent: 09/01/2020  To: Bridget Hartshorn Nurse [facility]  Subject: INR Due 09/01/20                                 Sandra Parks K is due for an INR test on 09/01/20.

## 2020-09-26 ENCOUNTER — Encounter: Admit: 2020-09-26 | Discharge: 2020-09-26 | Payer: MEDICARE

## 2020-09-26 DIAGNOSIS — I480 Paroxysmal atrial fibrillation: Secondary | ICD-10-CM

## 2020-09-26 LAB — PROTIME INR (PT): Lab: 2.9

## 2020-10-01 MED ORDER — CARVEDILOL 12.5 MG PO TAB
ORAL_TABLET | Freq: Two times a day (BID) | 0 refills
Start: 2020-10-01 — End: ?

## 2020-10-28 ENCOUNTER — Encounter: Admit: 2020-10-28 | Discharge: 2020-10-28 | Payer: MEDICARE

## 2020-10-28 NOTE — Progress Notes
Spoke to [place] re: INR results. Verified current Coumadin dose. Pt to continue current therapeutic dose and recheck in ~ 12/22. Kalicia verbalized understanding and was agreeable to this plan. No further needs identified at this time.

## 2020-10-28 NOTE — Telephone Encounter
[facility] ROI Dept  Fax: [phone]    Sandra Parks (DOB 1946-07-27) is also followed by cardiologist, Dedra Skeens, MD.    Please send the following records for continuity of care: INR from 10/12/20.    Please fax results to:     [facility]   Cardiovascular Medicine Department  [name] / [name] office fax: [phone]               Thank you,     Sue Lush, RN  Please call [phone] with any questions or concerns.

## 2020-10-28 NOTE — Telephone Encounter
Spoke to pt re: overdue INR and she states she had INR drawn as part of ER visit to [facility] on 411/24 and reports INR was 2.3 at that time. Pt states she has continued same warfarin dose since of 4mg  M,W,F and 5mg  all other days. Advised pt I would confirm results with [facility] and to continue current dose and recheck around Christmas. Pt verbalized understanding and was agreeable to this plan.    Spoke to staff in ROI dept at [facility] hospital who confirmed they have INR result from 11/24 and will be able to send to me once they receive a faxed request from pt's chart.    ----- Message -----  From: [facility], RN  Sent: 10/17/2020  To: Cvm Nurse [facility]  Subject: INR Due 10/17/20                                 10/19/2020 K is due for an INR test on 10/17/20.

## 2020-12-07 ENCOUNTER — Encounter: Admit: 2020-12-07 | Discharge: 2020-12-07 | Payer: MEDICARE

## 2020-12-07 DIAGNOSIS — Z8679 Personal history of other diseases of the circulatory system: Secondary | ICD-10-CM

## 2020-12-07 DIAGNOSIS — I480 Paroxysmal atrial fibrillation: Secondary | ICD-10-CM

## 2020-12-07 DIAGNOSIS — Z7901 Long term (current) use of anticoagulants: Secondary | ICD-10-CM

## 2020-12-07 NOTE — Progress Notes
Sent pt reminder via my chart.    ----- Message -----  From: Caryl Pina, RN  Sent: 11/09/2020  To: Bridget Hartshorn Nurse [place]  Subject: INR Due 11/09/20                                 Sandra Parks is due for an INR test on 11/09/20.

## 2020-12-07 NOTE — Progress Notes
Received msg on nurse line from pt reporting her INR was 4.9 this past Saturday and asking for call back to further discuss. Spoke to pt in f/u and she informed me she was recently hospitalized in [place] with [place] and respiratory illness. She states she has been taken off her triamterene and lisinopril due to low BP. She was advised to cut warfarin dose in half and recheck this Friday 1/21 in [place]. Will f/u with pt on Friday.

## 2020-12-09 ENCOUNTER — Encounter: Admit: 2020-12-09 | Discharge: 2020-12-09 | Payer: MEDICARE

## 2020-12-09 NOTE — Telephone Encounter
Called and left message regarding needing an INR after elevated reading earlier this week. Left call back number requesting return call for INR result.

## 2020-12-12 ENCOUNTER — Encounter: Admit: 2020-12-12 | Discharge: 2020-12-12 | Payer: MEDICARE

## 2020-12-12 DIAGNOSIS — I480 Paroxysmal atrial fibrillation: Secondary | ICD-10-CM

## 2021-01-16 ENCOUNTER — Encounter: Admit: 2021-01-16 | Discharge: 2021-01-16 | Payer: MEDICARE

## 2021-01-16 DIAGNOSIS — I480 Paroxysmal atrial fibrillation: Secondary | ICD-10-CM

## 2021-01-16 DIAGNOSIS — Z7901 Long term (current) use of anticoagulants: Secondary | ICD-10-CM

## 2021-01-16 DIAGNOSIS — Z8679 Personal history of other diseases of the circulatory system: Secondary | ICD-10-CM

## 2021-01-27 ENCOUNTER — Encounter: Admit: 2021-01-27 | Discharge: 2021-01-27 | Payer: MEDICARE

## 2021-01-27 DIAGNOSIS — R000 Tachycardia, unspecified: Secondary | ICD-10-CM

## 2021-01-27 DIAGNOSIS — I509 Heart failure, unspecified: Secondary | ICD-10-CM

## 2021-01-27 DIAGNOSIS — J189 Pneumonia, unspecified organism: Secondary | ICD-10-CM

## 2021-01-31 ENCOUNTER — Encounter: Admit: 2021-01-31 | Discharge: 2021-01-31 | Payer: MEDICARE

## 2021-01-31 NOTE — Telephone Encounter
INR Overdue.  Called and left message requesting pt to have INR drawn at earliest convenience.  Left callback number for any questions or concerns.

## 2021-02-09 ENCOUNTER — Encounter: Admit: 2021-02-09 | Discharge: 2021-02-09 | Payer: MEDICARE

## 2021-02-09 DIAGNOSIS — I480 Paroxysmal atrial fibrillation: Secondary | ICD-10-CM

## 2021-02-09 DIAGNOSIS — D509 Iron deficiency anemia, unspecified: Secondary | ICD-10-CM

## 2021-02-09 DIAGNOSIS — I10 Essential (primary) hypertension: Secondary | ICD-10-CM

## 2021-02-09 DIAGNOSIS — E538 Deficiency of other specified B group vitamins: Secondary | ICD-10-CM

## 2021-02-09 DIAGNOSIS — E119 Type 2 diabetes mellitus without complications: Secondary | ICD-10-CM

## 2021-02-09 DIAGNOSIS — I4892 Unspecified atrial flutter: Secondary | ICD-10-CM

## 2021-02-09 DIAGNOSIS — I471 Supraventricular tachycardia: Secondary | ICD-10-CM

## 2021-02-09 DIAGNOSIS — Z7901 Long term (current) use of anticoagulants: Secondary | ICD-10-CM

## 2021-02-09 DIAGNOSIS — E785 Hyperlipidemia, unspecified: Secondary | ICD-10-CM

## 2021-02-09 NOTE — Progress Notes
Date of Service: 02/09/2021    Sandra Parks is a 75 y.o. female.       HPI    Obtained patient's, or patient proxy's, verbal consent to treat them and their agreement to [facility] financial policy and NPP via this telehealth visit during the [facility] Emergency  Sandra Parks indicated that she had recently contracted influenza and did not want to come into clinic today.  She was not able to conduct a visit using Social worker.  She was seen in the emergency department on February 07, 2021 with cough and pleuritic discomfort.  She reported dyspnea and diarrhea.  The onset of her symptoms was 02/04/2021.  She tested positive for influenza and was started on Tamiflu.  She still believes that she has low-grade fever but no shakes or chills.  Her cough has improved and is nonproductive.  She has intermittent bilateral infraclavicular discomfort which appears to be pleuritic.  She has dyspnea with exertion but is comfortable at rest.  She is not aware of palpitations.  She reports lightheadedness with standing.  I notice that she was seen in the emergency room locally on January 14, 2021 for altered mental status with disorientation and confusion.  She was noted to be hypoglycemic and to have pneumonia and was admitted.  She was also seen in the emergency room on December 05, 2020 for hypotension and was discharged home.  She was hospitalized from November 19, 2020 until November 24, 2020 for atrial fibrillation, COPD and for an exacerbation and bronchitis.  Historically, in February 2004, she underwent radiofrequency ablation for atrial flutter, although this appeared to be only 1 of the supraventricular arrhythmias that she was having. She has been on Coumadin because her CHA2DS2-VASc score for stroke and systemic embolization risk is now 4, namely for gender, age, hypertension and diabetes mellitus. In December 2012 warfarin was stopped 4-5 days prior to surgery to repair her left rotator cuff. The patient reports that surgery was uneventful. Warfarin was resumed after surgery but stopped, once again, 4-5 days prior to surgery to repair her right rotator cuff on 01/03/12. On 01/06/12 she became short of breath and on 01/07/12 she was admitted to [facility] with a diagnosis of pulmonary embolism. Sandra Parks was treated with unfractionated heparin and then warfarin was resumed. 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 She was hospitalized from 12/09/13 thru 12/11/13 for sinusitis but also reports treatment for bronchitis and pneumonia. She required treatment with steroids which made her blood pressure and blood sugar difficult to control. Sandra Parks reports that she was hospitalized again for approximately 4 days in February 2015 for a sinus infection requiring antibiotic therapy. The patient reports that she had her sinuses irrigated as an outpatient in March 2015 and has had no recurrent sinus infections. The patient fell on 01/19/15 and suffered a comminuted fracture of the distal left radius. She reports that she did not require surgery and was place in a protective sling. She reports one other fall.??Her falls were associated with?lightheaded which occurred?with standing for 15 minutes.?Due to her orthostatic symptoms, her hydralazine was discontinued and her carvedilol was reduced to 12.5 mg twice daily from 25 mg twice daily. She felt much better after this dose reduction.?The patient reports having a persistent sinus infection over the wintertime 2017/2018?associated with ocular migraines. She stopped her warfarin in preparation for sinus surgery and underwent sinus surgery the week of July 08, 2017. 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 ?She was hospitalized until 07/22/2017. ?Her warfarin was restarted. ?On 08/01/2017 she had a syncopal episode shortly after urinating and then standing. She felt lightheaded prior to losing consciousness and she reports that she fractured her left hand.??This was repaired surgically in late September 2018. ?This time she received anticoagulation bridging when her warfarin was stopped in preparation for orthopedic surgery and she reports no complications from surgery.?Sandra Parks?was seen in the emergency department at [facility] hospital?on 10/21/2018 following an overdose of medication. Her son died unexpectedly in 13-Nov-2019.         Vitals:    02/09/21 1313   BP: 114/77   BP Source: Arm, Left Upper   Patient Position: Sitting   Pulse: 104   SpO2: 97%   Weight: 129.3 kg (285 lb)   Height: 175.3 cm (5' 9)   PainSc: Six     Body mass index is 42.09 kg/m?Marland Kitchen     Past Medical History  Patient Active Problem List    Diagnosis Date Noted   ? History of pulmonary embolism 05/28/2012   ? Syncope 01/14/2012   ? History of atrial flutter 01/14/2012     2004: RFA     ? AKI (acute kidney injury) (HCC) 01/14/2012   ? Chronic anticoagulation 01/14/2012   ? Iron deficiency anemia 01/13/2012   ? B12 deficiency 01/13/2012   ? Bilateral pulmonary embolism (HCC) 01/08/2012   ? Hypertension 07/12/2009   ? Supraventricular tachycardia (HCC) 07/12/2009   ? Hyperlipidemia 07/12/2009   ? Type II diabetes mellitus (HCC) 07/12/2009   ? PAF (paroxysmal atrial fibrillation) (HCC) 04/12/2009         Review of Systems   Constitutional: Positive for malaise/fatigue.   Eyes: Negative.    Cardiovascular: Positive for dyspnea on exertion and leg swelling.   Respiratory: Positive for cough, sleep disturbances due to breathing and wheezing.    Endocrine: Negative.    Hematologic/Lymphatic: Negative.    Skin: Negative.    Musculoskeletal: Positive for falls, joint pain, muscle weakness and myalgias.   Gastrointestinal: Negative.    Genitourinary: Negative.    Neurological: Positive for dizziness, headaches, light-headedness and weakness.   Psychiatric/Behavioral: Negative.    Allergic/Immunologic: Negative. Physical Exam  Today's visit was held with a phone conversation and therefore an examination was not performed.  Cardiovascular Studies  I see an ECG from 01/14/2021 which reveals atrial fibrillation with a heart rate of 109 bpm.  A twelve-lead ECG from December 05, 2020 reveals atrial fibrillation with a heart rate of 106 6 bpm.  A twelve-lead ECG from November 26, 2020 reveals atrial fibrillation with a heart rate of 95 bpm.    A twelve-lead ECG was obtained on 12/08/2019 reveals normal sinus rhythm with a heart rate of 58 bpm. ?There is no evidence of myocardial ischemia or infarction.    An echo Doppler study obtained on 07/02/2017 revealed:  1. No regional wall motion abnormalities are seen. Overall LV systolic function appears normal. The estimated left ventricular ejection fraction is 55-60%. Left ventricular contractility appears similar when compared with the prior echocardiogram performed on 04/22/13.  2. Right ventricular contractility appears normal.  3. Severe left atrial enlargement. ?Mild right atrial enlargement  4. Mild mitral valve regurgitation.  5. No pericardial effusion is seen.  6. The estimated peak systolic PA pressure equals 44 mmHg.  ?  A regadenoson thallium stress study obtained on 07/05/2017 revealed:?  Regional Wall Thickening and Motion Post  Stress: ??There is borderline normal left ventricular wall motion and thickening of all myocardial segments. ?Left Ventricular Ejection Fraction (post stress, in the resting state) =??49 %. Left Ventricular End Diastolic Volume: 191 mL. ?  SUMMARY/OPINION:??This study is probably normal with no evidence of significant myocardial ischemia. There is fixed attenuation in the anterior and anterolateral wall that is likely due to breast attenuation althougha prior injury cannot be excluded. ?Left ventricular systolic function is borderline normal. There are no high risk prognostic indicators present. ?The ECG portion of the study is negative for ischemia. Comparison is made with a prior study completed 201`4. ?Ejection fraction was 52%. ?There are no significant changes. The anterior attenuation that is seen on the current image was present on the prior study. In aggregate the current study is low risk in regards to predicted annual cardiovascular mortality rate.    Cardiovascular Health Factors  Vitals BP Readings from Last 3 Encounters:   02/09/21 114/77   04/26/20 132/76   12/08/19 132/72     Wt Readings from Last 3 Encounters:   02/09/21 129.3 kg (285 lb)   04/26/20 132.5 kg (292 lb)   12/08/19 131.5 kg (290 lb)     BMI Readings from Last 3 Encounters:   02/09/21 42.09 kg/m?   04/26/20 43.12 kg/m?   12/08/19 42.83 kg/m?      Smoking Social History     Tobacco Use   Smoking Status Never Smoker   Smokeless Tobacco Never Used      Lipid Profile Cholesterol   Date Value Ref Range Status   12/07/2019 138  Final     HDL   Date Value Ref Range Status   12/07/2019 34 (L) >40 Final     LDL   Date Value Ref Range Status   12/07/2019 63  Final     Triglycerides   Date Value Ref Range Status   12/07/2019 205 (H) <150 Final      Blood Sugar Hemoglobin A1C   Date Value Ref Range Status   12/07/2019 8.0 (H) <5.7 Final     Glucose   Date Value Ref Range Status   12/07/2019 215 (H) 70 - 105 Final   10/23/2018 108  Final   02/19/2018 146 (H) 80 - 115 Final   03/26/2004 120 (H) 70 - 110 MG/DL Final   47/82/9562 130 (H) 70 - 110 MG/DL Final     Glucose, POC   Date Value Ref Range Status   01/16/2012 93 70 - 100 MG/DL Final   86/57/8469 629 (H) 70 - 100 MG/DL Final   52/84/1324 401 (H) 70 - 100 MG/DL Final          Problems Addressed Today  Encounter Diagnoses   Name Primary?   ? 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 PAF (paroxysmal atrial fibrillation) (HCC)        Assessment and Plan     Sandra Parks now appears to have recurrent persistent atrial fibrillation.  She indicates that she has been compliant with her anticoagulation and wants to continue it. Her CHA2DS2-VASc score for stroke and systemic embolization risk is now 4, namely for gender, age, hypertension and diabetes mellitus. ?She also had a complication of pulmonary embolism following orthopedic surgery in 2013 and?in 2008?had recurrent pulmonary embolism when she held her warfarin in the perioperative period for sinus surgery in 2018. The patient understands that anticoagulation is used to decrease thrombotic or  clotting complications such as stroke, systemic embolization and pulmonary embolization which can be disabling or fatal, but can, on occasion, lead to life-threatening or disabling bleeding complications including gastrointestinal and intracranial hemorrhage.?Anticoagulation options were presented to the patient which included warfarin or one of the newer direct acting oral anticoagulants and the patient wanted to?continue?warfarin. I have asked Sandra Parks to continue working with the cardiovascular nurses so that her INR can be monitored and her dose of warfarin adjusted accordingly.?I have asked Sandra Parks to continue working with her primary care physician for control of her diabetes mellitus.  I have asked her to try to reschedule an actual clinic visit with me when her influenza resolves so we can discuss further management of her atrial fibrillation and evaluate her for any decompensation of congestive heart failure.The total time spent during this interview was 30 minutes.         Current Medications (including today's revisions)  ? alendronate (FOSAMAX) 70 mg tablet Take 70 mg by mouth every 7 days.   ? calcium carbonate/vitamin D-3 (OSCAL-500+D) 1250 mg/200 unit tablet Take 1 tablet by mouth twice daily. Calcium Carb 1250mg  delivers 500mg  elemental Ca   ? carvediloL (COREG) 12.5 mg tablet TAKE 1 TABLET BY MOUTH TWICE DAILY (MUST  MAKE  OFFICE  VISIT  FOR  ADDITIONAL  REFILLS)   ? LANTUS SOLOSTAR U-100 INSULIN 100 unit/mL (3 mL) injection PEN Inject 30 Units under the skin twice daily as needed.   ? lisinopriL (ZESTRIL) 10 mg tablet Take 10 mg by mouth daily. CURRENTLY ON HOLD SINCE JAN 2022   ? multivitamin (MULTIPLE VITAMIN) per tablet Take 1 Tab by mouth Daily.   ? oseltamivir (TAMIFLU) 75 mg capsule Take 75 mg by mouth twice daily.   ? pantoprazole DR (PROTONIX) 40 mg tablet Take 40 mg by mouth daily.   ? potassium chloride (KLOR-CON) 20 mEq packet Take 20 mEq by mouth daily.   ? rosuvastatin (CRESTOR) 20 mg tablet Take 1 tablet by mouth once daily   ? sertraline (ZOLOFT) 100 mg tablet Take 100 mg by mouth at bedtime daily.   ? triamterene-hydrochlorothiazide (MAXZIDE) 37.5-25 mg tablet Take 1 tablet by mouth once daily (Patient taking differently: CURRENTLY ON HOLD SINCE JAN 2022)   ? warfarin (COUMADIN) 2 mg tablet TAKE 2 TABLETS BY MOUTH ONCE DAILY AS DIRECTED FOR INR   ? warfarin (COUMADIN) 6 mg tablet TAKE 1 TABLET BY MOUTH ONCE DAILY AS DIRECTED

## 2021-02-20 ENCOUNTER — Encounter: Admit: 2021-02-20 | Discharge: 2021-02-20 | Payer: MEDICARE

## 2021-02-20 ENCOUNTER — Ambulatory Visit: Admit: 2021-02-20 | Discharge: 2021-02-20 | Payer: MEDICARE

## 2021-02-20 DIAGNOSIS — R000 Tachycardia, unspecified: Secondary | ICD-10-CM

## 2021-02-20 DIAGNOSIS — J189 Pneumonia, unspecified organism: Secondary | ICD-10-CM

## 2021-02-20 DIAGNOSIS — I509 Heart failure, unspecified: Secondary | ICD-10-CM

## 2021-02-20 MED ORDER — PERFLUTREN LIPID MICROSPHERES 1.1 MG/ML IV SUSP
1-20 mL | Freq: Once | INTRAVENOUS | 0 refills | Status: CP | PRN
Start: 2021-02-20 — End: ?

## 2021-02-20 NOTE — Telephone Encounter
INR Overdue. Called and left message requesting pt to have INR drawn at earliest convenience.  Left callback number for any questions or concerns.

## 2021-02-21 ENCOUNTER — Encounter: Admit: 2021-02-21 | Discharge: 2021-02-21 | Payer: MEDICARE

## 2021-02-21 DIAGNOSIS — I480 Paroxysmal atrial fibrillation: Secondary | ICD-10-CM

## 2021-02-21 DIAGNOSIS — I10 Essential (primary) hypertension: Secondary | ICD-10-CM

## 2021-02-21 DIAGNOSIS — E785 Hyperlipidemia, unspecified: Secondary | ICD-10-CM

## 2021-02-21 DIAGNOSIS — I471 Supraventricular tachycardia: Secondary | ICD-10-CM

## 2021-02-21 NOTE — Telephone Encounter
Results and recommendations called to patient. Patient has no questions at this time. Patient scheduled for holter application in [place] at 1:30pm 02/22/21. Patient verified D-T-L.

## 2021-02-21 NOTE — Telephone Encounter
-----   Message from Hester Mates, MD sent at 02/20/2021  5:58 PM CDT -----  Regarding: RE: LV contractility has decreased compared to the prior study  To all: It appears that her ejection fraction has dropped.  She may need better heart rate control.  Please try to schedule her for a 48 or 72-hour Holter monitor so we can assess her heart rate prior to her next clinic visit.  Please ask her to keep her clinic visit for this month.  Thanks.  SBG  ----- Message -----  From: Willette Pa, MD  Sent: 02/20/2021   5:22 PM CDT  To: Hester Mates, MD  Subject: LV contractility has decreased compared to t#

## 2021-02-22 ENCOUNTER — Encounter: Admit: 2021-02-22 | Discharge: 2021-02-22 | Payer: MEDICARE

## 2021-02-22 ENCOUNTER — Ambulatory Visit: Admit: 2021-02-22 | Discharge: 2021-02-22 | Payer: MEDICARE

## 2021-02-22 DIAGNOSIS — E785 Hyperlipidemia, unspecified: Secondary | ICD-10-CM

## 2021-02-22 DIAGNOSIS — I480 Paroxysmal atrial fibrillation: Secondary | ICD-10-CM

## 2021-02-22 DIAGNOSIS — I471 Supraventricular tachycardia: Secondary | ICD-10-CM

## 2021-02-22 DIAGNOSIS — I10 Essential (primary) hypertension: Secondary | ICD-10-CM

## 2021-02-22 NOTE — Progress Notes
Ambulatory (External) Cardiac Monitor Placement Record    Patient was seen today for placement of an ambulatory cardiac monitor.        BrandLezlie Parks (CardioNet)  Serial Number: [id number]  Location where monitor was placed:  Clinic  Start Time and Date: 02/22/21 2:39 PM  Will Holter be returned by mail? Yes    Ordering Provider: SBG  Diagnosis: Afib    Patient instructed to contact company phone number on the monitor box with questions regarding billing, placement, troubleshooting.     Redge Gainer

## 2021-03-09 ENCOUNTER — Encounter: Admit: 2021-03-09 | Discharge: 2021-03-09 | Payer: MEDICARE

## 2021-03-09 DIAGNOSIS — I480 Paroxysmal atrial fibrillation: Secondary | ICD-10-CM

## 2021-03-09 DIAGNOSIS — E785 Hyperlipidemia, unspecified: Secondary | ICD-10-CM

## 2021-03-09 DIAGNOSIS — Z8679 Personal history of other diseases of the circulatory system: Secondary | ICD-10-CM

## 2021-03-09 DIAGNOSIS — E538 Deficiency of other specified B group vitamins: Secondary | ICD-10-CM

## 2021-03-09 DIAGNOSIS — I509 Heart failure, unspecified: Secondary | ICD-10-CM

## 2021-03-09 DIAGNOSIS — D509 Iron deficiency anemia, unspecified: Secondary | ICD-10-CM

## 2021-03-09 DIAGNOSIS — I4892 Unspecified atrial flutter: Secondary | ICD-10-CM

## 2021-03-09 DIAGNOSIS — I2699 Other pulmonary embolism without acute cor pulmonale: Secondary | ICD-10-CM

## 2021-03-09 DIAGNOSIS — I10 Essential (primary) hypertension: Secondary | ICD-10-CM

## 2021-03-09 DIAGNOSIS — E119 Type 2 diabetes mellitus without complications: Secondary | ICD-10-CM

## 2021-03-09 DIAGNOSIS — Z7901 Long term (current) use of anticoagulants: Secondary | ICD-10-CM

## 2021-03-09 DIAGNOSIS — I471 Supraventricular tachycardia: Secondary | ICD-10-CM

## 2021-03-09 MED ORDER — POTASSIUM CHLORIDE 10 MEQ PO TBER
20 meq | ORAL_TABLET | Freq: Every day | ORAL | 3 refills | 30.00000 days | Status: AC
Start: 2021-03-09 — End: ?

## 2021-03-09 MED ORDER — METOPROLOL SUCCINATE 50 MG PO TB24
50 mg | ORAL_TABLET | Freq: Every day | ORAL | 3 refills | 90.00000 days | Status: AC
Start: 2021-03-09 — End: ?

## 2021-03-09 MED ORDER — APIXABAN 5 MG PO TAB
5 mg | ORAL_TABLET | Freq: Two times a day (BID) | ORAL | 3 refills | Status: AC
Start: 2021-03-09 — End: ?

## 2021-03-09 MED ORDER — FUROSEMIDE 40 MG PO TAB
40 mg | ORAL_TABLET | Freq: Every morning | ORAL | 1 refills | 90.00000 days | Status: AC
Start: 2021-03-09 — End: ?

## 2021-03-09 NOTE — Patient Instructions
PRE-ADMISSION INSTRUCTIONS    Patient Name: Sandra Parks  MRN#: [id number]  75 y.o.  Today's Date: 03/09/2021    ARRIVAL TIME    Please report to the [facility] office at [facility]: 04/27/21  Report at the following time: 9am      PROCEDURE    Your scheduled procedure: Cardioversion      DIET AND FLUID INTAKE    DO NOT EAT OR DRINK AFTER MIDNIGHT THE DAY PRIOR TO YOUR PROCEDURE: Verified  NO CAFFEINE FOR 24 HOURS PRIOR TO SCHEDULED PROCEDURE (I.E., COFFEE, CHOCOLATE, SODA): Verified  TAKE ALL OF YOUR MEDICATION WITH A SMALL SIP OF WATER IN THE MORNING OF THE PROCEDURE UNLESS YOU HAVE BEEN INSTRUCTED OTHERWISE: Verified    ALLERGIES  Allergies   Allergen Reactions   ? Ceftriaxone HIVES     Has tolerated amoxicillin per pt report   ? Ciprocinonide HIVES   ? Sulfa (Sulfonamide Antibiotics) HIVES       CURRENT MEDICATIONS  Outpatient Encounter Medications as of 03/09/2021   Medication Sig Dispense Refill   ? alendronate (FOSAMAX) 70 mg tablet Take 70 mg by mouth every 7 days.     ? apixaban (ELIQUIS) 5 mg tablet Take one tablet by mouth twice daily. 180 tablet 3   ? calcium carbonate/vitamin D-3 (OSCAL-500+D) 1250 mg/200 unit tablet Take 1 tablet by mouth twice daily. Calcium Carb 1250mg  delivers 500mg  elemental Ca     ? LANTUS SOLOSTAR U-100 INSULIN 100 unit/mL (3 mL) injection PEN Inject 30 Units under the skin twice daily as needed.     ? metoprolol XL (TOPROL XL) 50 mg extended release tablet Take one tablet by mouth daily. 90 tablet 3   ? multivitamin (MULTIPLE VITAMIN) per tablet Take 1 Tab by mouth Daily.     ? pantoprazole DR (PROTONIX) 40 mg tablet Take 40 mg by mouth daily.     ? potassium chloride (KLOR-CON) 20 mEq packet Take 20 mEq by mouth daily.     ? rosuvastatin (CRESTOR) 20 mg tablet Take 1 tablet by mouth once daily 90 tablet 3   ? sertraline (ZOLOFT) 100 mg tablet Take 100 mg by mouth at bedtime daily.       No facility-administered encounter medications on file as of 03/09/2021.       SPECIAL MEDICATIONS INSTRUCTIONS          Hold Diuretic(s): Hold Lasix the morning of the procedure  Hold Insulin (SA): Hold Insulin the morning of the procedure    Additional Instructions     You will need to have have driver take you home from the hospital.    PRE-ADMISSION LAB WORK    Lab Work: [employer]  Other Lab: [facility]  Date Lab Drawn: 04/27/21  Lab Location: prior to cardioversion    ADDITIONAL INFORMATION  Form Completed By: Weston Brass  Date Completed: 03/09/21       Electrical Cardioversion     Cut away image of heart showing SA node, right atrium, AV node, and left atrium   You had a cardioversion today. This is an electrical shock applied to the chest. The shock?reset your heart rhythm?back to normal. Your chest wall and chest muscles may feel sore for a few days. Some redness may appear on the skin on your chest where the cardioversion patches were applied. That will go away within a week.   To get ready for this procedure, you may have been given medicine to help you relax and to reduce pain. Depending  on the medicine used,?it could take up to?8 hours for the effect to wear off.   Home care  Follow these guidelines when caring for yourself at home:   ? Have a responsible adult watch you for the next 8 hours. This is in case your condition gets worse.  ? Don?t take any oral medicine for pain or sleep during the next 4 hours. These medicines might react with the medicines you were given in the hospital. This can cause a much stronger response than usual.  ? Don?t drink?any?alcohol for the next 24 hours.  ? Don?t?drive?or operate dangerous machinery during the next 24 hours.  ? Your healthcare provider will have prescribed medicines to stop the abnormal heart rhythm from coming back. Take these medicines as directed. Expect to take blood thinners for at least 4 weeks. This course of blood thinners should not be interrupted. Don't schedule other invasive procedures during this time. Interrupting this medicine could increase your risk for a stroke after a cardioversion.  ? You may use acetaminophen to control pain, unless another pain medicine was prescribed. Ibuprofen may also be considered, however this can increase risk of bleeding when taken with blood thinners. If you have chronic liver or kidney disease, talk with your provider before taking these medicines. Also talk with your provider if you?ve had a stomach ulcer or gastrointestinal bleeding.  Follow-up care  Follow up with your healthcare provider, or as advised, if you aren?t alert and back to your usual level of activity within 12 hours.?   Call 911  Call 911 if you have:?   ? Pain in your chest, arm, shoulder, neck or upper back  ? You have problems speaking or seeing  ? Weakness in an arm or leg  ? You are unable to move your arm or leg on one side of your body  ? You have uncontrolled bleeding from blood thinning medicines?  When to seek medical advice  Call your healthcare provider right away if any of these occur:   ? Weakness, dizziness, lightheadedness, or fainting  ? Shortness of breath  ? You feel like your heart is fluttering or beating fast, hard, or irregularly (palpitations)  ? More than minor skin discomfort or redness where the cardioversion pads were placed?  [employer] last reviewed this educational content on 04/19/2018  ? 2000-2021 [employer], [place]. All rights reserved. This information is not intended as a substitute for professional medical care. Always follow your healthcare professional's instructions.

## 2021-03-20 ENCOUNTER — Encounter: Admit: 2021-03-20 | Discharge: 2021-03-20 | Payer: MEDICARE

## 2021-03-20 NOTE — Telephone Encounter
Reviewed results and recommendations with patient. Patient is agreeable to care plan and has no further questions at this time. She states that she will monitor heart rate through her BP cuff at home.

## 2021-03-20 NOTE — Telephone Encounter
-----   Message from Hester Mates, MD sent at 03/17/2021  4:59 PM CDT -----  To all: Her heart rate is a little fast on her Holter monitor.  Please increase her Toprol-XL to 50 mg in the morning and 25 mg in the evening.  See if there is some way she can monitor her heart rate periodically at home through a blood pressure cuff or some other means.  Have her report her heart rate results in 2 weeks to see if she needs to increase her Toprol-XL further.  Thanks.  SBG  ----- Message -----  From: Hester Mates, MD  Sent: 03/17/2021   4:57 PM CDT  To: Hester Mates, MD

## 2021-03-23 ENCOUNTER — Encounter: Admit: 2021-03-23 | Discharge: 2021-03-23 | Payer: MEDICARE

## 2021-03-23 MED ORDER — ROSUVASTATIN 20 MG PO TAB
ORAL_TABLET | Freq: Every day | ORAL | 0 refills | 90.00000 days | Status: AC
Start: 2021-03-23 — End: ?

## 2021-03-23 NOTE — Telephone Encounter
Left msg on cell # re: overdue INR reminder. Left [facility] nurse line # for call back prn.    ----- Message -----  From: Weston Brass  Sent: 01/23/2021  12:00 AM CDT  To: [facility] Nurse [place]  Subject: INR Due 01/23/21                                   Sandra Parks is due for an INR test on 01/23/21.

## 2021-04-04 ENCOUNTER — Inpatient Hospital Stay: Admit: 2021-04-04 | Discharge: 2021-04-04 | Payer: MEDICARE

## 2021-04-04 ENCOUNTER — Encounter: Admit: 2021-04-04 | Discharge: 2021-04-04 | Payer: MEDICARE

## 2021-04-04 ENCOUNTER — Inpatient Hospital Stay: Admit: 2021-04-04 | Payer: MEDICARE

## 2021-04-04 DIAGNOSIS — E538 Deficiency of other specified B group vitamins: Secondary | ICD-10-CM

## 2021-04-04 DIAGNOSIS — I2699 Other pulmonary embolism without acute cor pulmonale: Secondary | ICD-10-CM

## 2021-04-04 DIAGNOSIS — E785 Hyperlipidemia, unspecified: Secondary | ICD-10-CM

## 2021-04-04 DIAGNOSIS — I10 Essential (primary) hypertension: Secondary | ICD-10-CM

## 2021-04-04 DIAGNOSIS — I480 Paroxysmal atrial fibrillation: Secondary | ICD-10-CM

## 2021-04-04 DIAGNOSIS — I4891 Unspecified atrial fibrillation: Secondary | ICD-10-CM

## 2021-04-04 DIAGNOSIS — I4892 Unspecified atrial flutter: Secondary | ICD-10-CM

## 2021-04-04 DIAGNOSIS — Z7901 Long term (current) use of anticoagulants: Secondary | ICD-10-CM

## 2021-04-04 DIAGNOSIS — D509 Iron deficiency anemia, unspecified: Secondary | ICD-10-CM

## 2021-04-04 DIAGNOSIS — I471 Supraventricular tachycardia: Secondary | ICD-10-CM

## 2021-04-04 DIAGNOSIS — E119 Type 2 diabetes mellitus without complications: Secondary | ICD-10-CM

## 2021-04-04 LAB — POC GLUCOSE
POC GLUCOSE: 165 mg/dL — ABNORMAL HIGH (ref 70–100)
POC GLUCOSE: 167 mg/dL — ABNORMAL HIGH (ref 70–100)
POC GLUCOSE: 142 mg/dL — ABNORMAL HIGH (ref 70–100)

## 2021-04-04 LAB — TROPONIN-I: TROPONIN I: 0 ng/mL (ref 0.0–0.05)

## 2021-04-04 LAB — PROTIME INR (PT)
INR: 2.8 — ABNORMAL HIGH (ref 0.8–1.2)
PROTIME: 33 s — ABNORMAL HIGH (ref 9.5–14.2)

## 2021-04-04 LAB — MAGNESIUM
MAGNESIUM: 1.8 mg/dL (ref 1.6–2.6)
MAGNESIUM: 1.9 mg/dL (ref 1.6–2.6)

## 2021-04-04 LAB — PTT (APTT): PTT: 32 s (ref 24.0–36.5)

## 2021-04-04 MED ORDER — METOPROLOL SUCCINATE 50 MG PO TB24
50 mg | Freq: Two times a day (BID) | ORAL | 0 refills | Status: AC
Start: 2021-04-04 — End: ?
  Administered 2021-04-05 – 2021-04-06 (×4): 50 mg via ORAL

## 2021-04-04 MED ORDER — CALCIUM CARBONATE-VITAMIN D3 500 MG-5 MCG (200 UNIT) PO TAB
1 | Freq: Two times a day (BID) | ORAL | 0 refills | Status: AC
Start: 2021-04-04 — End: ?
  Administered 2021-04-05 – 2021-04-07 (×6): 1 via ORAL

## 2021-04-04 MED ORDER — INSULIN ASPART 100 UNIT/ML SC FLEXPEN
0-7 [IU] | Freq: Before meals | SUBCUTANEOUS | 0 refills | Status: AC
Start: 2021-04-04 — End: ?
  Administered 2021-04-04: 22:00:00 1 [IU] via SUBCUTANEOUS

## 2021-04-04 MED ORDER — ALBUTEROL SULFATE 2.5 MG/0.5 ML IN NEBU
2.5 mg | RESPIRATORY_TRACT | 0 refills | Status: AC | PRN
Start: 2021-04-04 — End: ?
  Administered 2021-04-04 – 2021-04-07 (×10): 2.5 mg via RESPIRATORY_TRACT

## 2021-04-04 MED ORDER — FUROSEMIDE 10 MG/ML IJ SOLN
40 mg | Freq: Once | INTRAVENOUS | 0 refills | Status: CP
Start: 2021-04-04 — End: ?
  Administered 2021-04-05: 01:00:00 40 mg via INTRAVENOUS

## 2021-04-04 MED ORDER — INSULIN GLARGINE 100 UNIT/ML (3 ML) SC INJ PEN
30 [IU] | Freq: Every evening | SUBCUTANEOUS | 0 refills | Status: AC
Start: 2021-04-04 — End: ?
  Administered 2021-04-05: 02:00:00 30 [IU] via SUBCUTANEOUS

## 2021-04-04 MED ORDER — SENNOSIDES-DOCUSATE SODIUM 8.6-50 MG PO TAB
1 | Freq: Every day | ORAL | 0 refills | Status: AC | PRN
Start: 2021-04-04 — End: ?

## 2021-04-04 MED ORDER — ONDANSETRON 4 MG PO TBDI
4 mg | ORAL | 0 refills | Status: AC | PRN
Start: 2021-04-04 — End: ?

## 2021-04-04 MED ORDER — POTASSIUM CHLORIDE 20 MEQ PO TBTQ
20 meq | Freq: Every day | ORAL | 0 refills | Status: AC
Start: 2021-04-04 — End: ?
  Administered 2021-04-05 – 2021-04-07 (×3): 20 meq via ORAL

## 2021-04-04 MED ORDER — ONDANSETRON HCL (PF) 4 MG/2 ML IJ SOLN
4 mg | INTRAVENOUS | 0 refills | Status: AC | PRN
Start: 2021-04-04 — End: ?

## 2021-04-04 MED ORDER — METOPROLOL TARTRATE 5 MG/5 ML IV SOLN
5 mg | Freq: Once | INTRAVENOUS | 0 refills | Status: CP
Start: 2021-04-04 — End: ?
  Administered 2021-04-04: 20:00:00 5 mg via INTRAVENOUS

## 2021-04-04 MED ORDER — POLYETHYLENE GLYCOL 3350 17 GRAM PO PWPK
1 | Freq: Every day | ORAL | 0 refills | Status: AC | PRN
Start: 2021-04-04 — End: ?

## 2021-04-04 MED ORDER — ALENDRONATE 70 MG PO TAB
70 mg | ORAL | 0 refills | Status: AC
Start: 2021-04-04 — End: ?

## 2021-04-04 MED ORDER — PANTOPRAZOLE 40 MG PO TBEC
40 mg | Freq: Every day | ORAL | 0 refills | Status: AC
Start: 2021-04-04 — End: ?
  Administered 2021-04-04 – 2021-04-07 (×4): 40 mg via ORAL

## 2021-04-04 MED ORDER — ROSUVASTATIN 20 MG PO TAB
20 mg | Freq: Every day | ORAL | 0 refills | Status: AC
Start: 2021-04-04 — End: ?
  Administered 2021-04-04 – 2021-04-07 (×4): 20 mg via ORAL

## 2021-04-04 MED ORDER — ACETAMINOPHEN 325 MG PO TAB
650 mg | ORAL | 0 refills | Status: AC | PRN
Start: 2021-04-04 — End: ?

## 2021-04-04 MED ORDER — MULTIVITAMIN PO TAB
1 | Freq: Every day | ORAL | 0 refills | Status: AC
Start: 2021-04-04 — End: ?
  Administered 2021-04-04 – 2021-04-07 (×4): 1 via ORAL

## 2021-04-04 MED ORDER — MELATONIN 5 MG PO TAB
5 mg | Freq: Every evening | ORAL | 0 refills | Status: AC | PRN
Start: 2021-04-04 — End: ?
  Administered 2021-04-05 – 2021-04-06 (×2): 5 mg via ORAL

## 2021-04-04 MED ORDER — SERTRALINE 100 MG PO TAB
100 mg | Freq: Every evening | ORAL | 0 refills | Status: AC
Start: 2021-04-04 — End: ?
  Administered 2021-04-05 – 2021-04-07 (×3): 100 mg via ORAL

## 2021-04-04 MED ORDER — APIXABAN 5 MG PO TAB
5 mg | Freq: Two times a day (BID) | ORAL | 0 refills | Status: AC
Start: 2021-04-04 — End: ?
  Administered 2021-04-05 – 2021-04-06 (×4): 5 mg via ORAL

## 2021-04-04 MED ORDER — FUROSEMIDE 10 MG/ML IJ SOLN
40 mg | Freq: Two times a day (BID) | INTRAVENOUS | 0 refills | Status: AC
Start: 2021-04-04 — End: ?
  Administered 2021-04-05 – 2021-04-06 (×3): 40 mg via INTRAVENOUS

## 2021-04-04 NOTE — Progress Notes
Date of Service: 04/04/2021    Sandra Parks is a 75 y.o. female.       HPI     Sandra Parks is followed for paroxysmal atrial flutter/fibrillation, hypertension, and supraventricular tachycardia. ?She is also followed for hyperlipidemia and diabetes mellitus.   When I saw her on March 09, 2021 she was in atrial fibrillation and on 03/10/2021 she switched from warfarin to apixaban in preparation for cardioversion.  Unfortunately she admits to missing several doses of apixaban over the past 3 weeks.  She has become more edematous and short of breath.  She has nocturnal dyspnea and orthopnea.  She has significant dyspnea on exertion preventing a lot of activities.  [name] reports considerable fatigue and lethargy.  She is limited to walking short distances and even has some difficulty transferring.  Her heart rate continues to run in the range of 100 -120 bpm when checked at home.  Her current doses of Toprol-XL used to control her heart rate is 50 mg in the morning and 25 mg in the afternoon.  When I saw her in April 2022 she declined hospitalization but now is agreeable.   She was seen in the emergency department on February 07, 2021 with cough and pleuritic discomfort. ?She reported dyspnea and diarrhea. ?The onset of her symptoms was 02/04/2021. ?She tested positive for influenza and was started on Tamiflu.  ?I notice?that she was seen in the emergency room locally on January 14, 2021 for altered mental status with disorientation and confusion. ?She was noted to be hypoglycemic and to have pneumonia and was admitted. ?She was also seen in the emergency room on December 05, 2020 for hypotension and?was discharged home. ?She was hospitalized from November 19, 2020 until November 24, 2020 for atrial fibrillation, COPD and for an?exacerbation and bronchitis. Otherwise, The patient reports no angina, and she is not aware of palpitations.  She reports no recent falls, presyncope or syncope. The patient reports no myalgias, bleeding abnormalities, or strokelike symptoms.  ?  Historically, in February 2004, she underwent radiofrequency ablation for atrial flutter, although this appeared to be only 1 of the supraventricular arrhythmias that she was having. She has been on Coumadin because her CHA2DS2-VASc score for stroke and systemic embolization risk is now 4, namely for gender, age, hypertension and diabetes mellitus. In December 2012 warfarin was stopped 4-5 days prior to surgery to repair her left rotator cuff. The patient reports that surgery was uneventful. Warfarin was resumed after surgery but stopped, once again, 4-5 days prior to surgery to repair her right rotator cuff on 01/03/12. On 01/06/12 she became short of breath and on 01/07/12 she was admitted to [facility] with a diagnosis of pulmonary embolism. Ms. Ports was treated with unfractionated heparin and then warfarin was resumed. [name] underwent left total knee replacement on 04/28/13. She was hospitalized from 12/09/13 thru 12/11/13 for sinusitis but also reports treatment for bronchitis and pneumonia. She required treatment with steroids which made her blood pressure and blood sugar difficult to control. [name] reports that she was hospitalized again for approximately 4 days in February 2015 for a sinus infection requiring antibiotic therapy. The patient reports that she had her sinuses irrigated as an outpatient in March 2015 and has had no recurrent sinus infections. The patient fell on 01/19/15 and suffered a comminuted fracture of the distal left radius. She reports that she did not require surgery and was place in a protective sling. She reports one other fall.??Her falls  were associated with?lightheaded which occurred?with standing for 15 minutes.?Due to her orthostatic symptoms, her hydralazine was discontinued and her carvedilol was reduced to 12.5 mg twice daily from 25 mg twice daily. She felt much better after this dose reduction.?The patient reports having a persistent sinus infection over the wintertime 2017/2018?associated with ocular migraines. She stopped her warfarin in preparation for sinus surgery and underwent sinus surgery the week of July 08, 2017. 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 ?She was hospitalized until 07/22/2017. ?Her warfarin was restarted. ?On 08/01/2017 she had a syncopal episode shortly after urinating and then standing. She felt lightheaded prior to losing consciousness and she reports that she fractured her left hand.??This was repaired surgically in late September 2018. ?This time she received anticoagulation bridging when her warfarin was stopped in preparation for orthopedic surgery and she reports no complications from surgery.?[name]?was seen in the emergency department at [facility] hospital?on 10/21/2018 following an overdose of medication.?Her son died unexpectedly?in November 03, 2019.         Vitals:    04/04/21 1046   BP: 118/80   BP Source: Arm, Left Upper   Pulse: (!) 128   SpO2: 95%   O2 Device: None (Room air)   PainSc: Zero   Weight: (!) 136.8 kg (301 lb 9.6 oz)   Height: 175.3 cm (5' 9)     Body mass index is 44.54 kg/m?Marland Kitchen     Past Medical History  Patient Active Problem List    Diagnosis Date Noted   ? Congestive heart failure, unspecified (HCC) 03/09/2021   ? 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 History of pulmonary embolism 05/28/2012   ? Syncope 01/14/2012   ? History of atrial flutter 01/14/2012     2004: RFA     ? AKI (acute kidney injury) (HCC) 01/14/2012   ? Chronic anticoagulation 01/14/2012   ? Iron deficiency anemia 01/13/2012   ? B12 deficiency 01/13/2012   ? Bilateral pulmonary embolism (HCC) 01/08/2012   ? Hypertension 07/12/2009   ? Supraventricular tachycardia (HCC) 07/12/2009   ? Hyperlipidemia 07/12/2009   ? Type II diabetes mellitus (HCC) 07/12/2009 ? PAF (paroxysmal atrial fibrillation) (HCC) 04/12/2009         Review of Systems   Constitutional: Negative.   HENT: Negative.    Eyes: Negative.    Cardiovascular: Negative.    Respiratory: Negative.    Endocrine: Negative.    Hematologic/Lymphatic: Negative.    Skin: Negative.    Musculoskeletal: Negative.    Gastrointestinal: Negative.    Genitourinary: Negative.    Neurological: Negative.    Psychiatric/Behavioral: Negative.    Allergic/Immunologic: Negative.        Physical Exam  GENERAL: The patient is well developed, well nourished, resting comfortably and in no distress. ?  HEENT: No abnormalities of the visible oro-nasopharynx, conjunctiva or sclera are noted.  NECK: + jugular venous distension. Carotids are palpable and without bruits. There is no thyroid enlargement.  Chest: Lung fields are clear to auscultation. There are no wheezes or crackles.  CV: There is an irregular cardiac rhythm with variable intensity of the first heart sound.  There are no murmurs, gallops or rubs. Her apical heart rate is?112?BPM.  ABD: The abdomen is soft and supple with normal bowel sounds. There is no hepatosplenomegaly, ascites, tenderness, masses or bruits.  Neuro: There are no focal motor defects. Ambulation  is normal. Cognitive function appears normal.  Ext:?There is 3+ bipedal edema without evidence of deep vein thrombosis. She does have 3+ bilateral lower leg edema. ?Peripheral pulses are satisfactory.   SKIN:?There are no rashes and no cellulitis  PSYCH:?The patient is calm, rationale and oriented.    Cardiovascular Studies  A twelve-lead ECG obtained on 04/04/2021 reveals atrial fibrillation with a heart rate of 117 bpm.  Multiple monomorphic premature ventricular ectopic beats are seen.  Nondiagnostic ST-T wave abnormalities are seen.  Echo Doppler 02/20/2021:  Interpretation Summary  ?  Possible left ventricular dilatation with moderately decreased global LV contractility. ?Visually estimated LVEF 35%.  Normal LV geometry.  Right ventricular dimensions are at the upper limits of normal to mildly dilated.  Mildly to moderately decreased right ventricular contractility.  Mild left atrial and moderate right atrial dilatation. ?Markedly elevated central venous pressure.  Calcified mitral chordae and mitral annulus. ?Monophasic mitral diastolic flow. ?Mild mitral regurgitation.  Normal tricuspid valve motion. ?Trace tricuspid regurgitation. ?Estimated PA pressure 49 mmHg.  The aortic valve not well-visualized but is probably sclerotic with normal aortic cusp motion. ?No aortic stenosis or aortic regurgitation on Doppler interrogation.  The pulmonic valve and the pulmonary artery are not well-visualized. ?Trace pulmonic regurgitation.  The transverse aorta is not well-visualized. ?Normal aortic root and proximal sending aortic dimensions.  No pericardial effusion.  ?  ?  A regadenoson thallium stress study obtained on 07/05/2017 revealed:?  Regional Wall Thickening and Motion Post Stress: ??There is borderline normal left ventricular wall motion and thickening of all myocardial segments. ?Left Ventricular Ejection Fraction (post stress, in the resting state) =??49 %. Left Ventricular End Diastolic Volume: 161 mL. ?  SUMMARY/OPINION:??This study is probably normal with no evidence of significant myocardial ischemia. There is fixed attenuation in the anterior and anterolateral wall that is likely due to breast attenuation althougha prior injury cannot be excluded. ?Left ventricular systolic function is borderline normal. There are no high risk prognostic indicators present. ?The ECG portion of the study is negative for ischemia. Comparison is made with a prior study completed 201`4. ?Ejection fraction was 52%. ?There are no significant changes. The anterior attenuation that is seen on the current image was present on the prior study. In aggregate the current study is low risk in regards to predicted annual cardiovascular mortality rate.  Cardiovascular Health Factors  Vitals BP Readings from Last 3 Encounters:   04/04/21 118/80   03/09/21 (!) 122/90   02/20/21 116/79     Wt Readings from Last 3 Encounters:   04/04/21 (!) 136.8 kg (301 lb 9.6 oz)   03/09/21 135.8 kg (299 lb 6.4 oz)   02/20/21 132.2 kg (291 lb 6.4 oz)     BMI Readings from Last 3 Encounters:   04/04/21 44.54 kg/m?   03/09/21 44.21 kg/m?   02/20/21 43.03 kg/m?      Smoking Social History     Tobacco Use   Smoking Status Never Smoker   Smokeless Tobacco Never Used      Lipid Profile Cholesterol   Date Value Ref Range Status   12/07/2019 138  Final     HDL   Date Value Ref Range Status   12/07/2019 34 (L) >40 Final     LDL   Date Value Ref Range Status   12/07/2019 63  Final     Triglycerides   Date Value Ref Range Status   12/07/2019 205 (H) <150 Final      Blood Sugar Hemoglobin A1C  Date Value Ref Range Status   12/07/2019 8.0 (H) <5.7 Final     Glucose   Date Value Ref Range Status   04/03/2021 168 (H) 70 - 105 Final   01/17/2021 132 (H) 70 - 105 Final   01/14/2021 44 (L) 70 - 105 Final   03/26/2004 120 (H) 70 - 110 MG/DL Final   29/52/8413 244 (H) 70 - 110 MG/DL Final     Glucose, POC   Date Value Ref Range Status   01/16/2012 93 70 - 100 MG/DL Final   11/21/7251 664 (H) 70 - 100 MG/DL Final   40/34/7425 956 (H) 70 - 100 MG/DL Final          Problems Addressed Today  Encounter Diagnoses   Name Primary?   ? Primary hypertension    ? PAF (paroxysmal atrial fibrillation) (HCC)        Assessment and Plan   1) Ms. Balow has atrial fibrillation with increased heart rate and decompensated congestive heart failure.  I have strongly encouraged hospitalization at [facility] hospital today.  She is now agreeable.    2) she will likely need considerable diuresis, along with the addition of additional anticongestive medication such as an angiotensin receptor blocker and spironolactone while monitoring her renal function.  She is diabetic and hopefully could be a candidate for an SGLT 2 transport inhibitor.  Sherryll Burger may be an additional consideration.  3) hopefully her heart rate can be controlled in the short-term with additional doses of Toprol-XL.  She would like to proceed with cardioversion.  Unfortunately she has missed some doses of Eliquis and will require a transesophageal echocardiogram prior to cardioversion.  She will likely require an antiarrhythmic agent in order to suppress atrial fibrillation recurrences.  Amiodarone could be a consideration.  I would recommend consulting the cardiac arrhythmia (EP) service prior to cardioversion for their suggestion of antiarrhythmic therapy. The total time spent during this interview and exam was 40 minutes.           Current Medications (including today's revisions)  ? alendronate (FOSAMAX) 70 mg tablet Take 70 mg by mouth every 7 days.   ? apixaban (ELIQUIS) 5 mg tablet Take one tablet by mouth twice daily.   ? calcium carbonate/vitamin D-3 (OSCAL-500+D) 1250 mg/200 unit tablet Take 1 tablet by mouth twice daily. Calcium Carb 1250mg  delivers 500mg  elemental Ca   ? furosemide (LASIX) 40 mg tablet Take one tablet by mouth every morning.   ? LANTUS SOLOSTAR U-100 INSULIN 100 unit/mL (3 mL) injection PEN Inject 30 Units under the skin twice daily as needed.   ? metoprolol XL (TOPROL XL) 50 mg extended release tablet Take one tablet by mouth daily.   ? pantoprazole DR (PROTONIX) 40 mg tablet Take 40 mg by mouth daily.   ? potassium chloride (K-TAB) 10 mEq tablet Take two tablets by mouth daily. Take with a meal and a full glass of water.   ? rosuvastatin (CRESTOR) 20 mg tablet Take 1 tablet by mouth once daily   ? sertraline (ZOLOFT) 100 mg tablet Take 100 mg by mouth at bedtime daily.   ? vitamins, multiple tablet Take 1 Tab by mouth Daily.

## 2021-04-04 NOTE — H&P (View-Only)
General Medicine Service  Admission History and Physical Examination      Name:  Sandra Parks                                             MRN:  [id number]   Admission Date:  04/04/2021                     Assessment/Plan:   Principal Problem:    Atrial fibrillation (HCC)      Sandra Parks is a 75 y.o. female with history of  has a past medical history of Atrial flutter (HCC), B12 deficiency (01/13/2012), Chronic anticoagulation (01/14/2012), Diabetes mellitus (HCC) (07/12/2009), Hyperlipidemia (07/12/2009), Hypertension (07/12/2009), Iron deficiency anemia (01/13/2012), PAF (paroxysmal atrial fibrillation) (HCC) (04/12/2009), Paroxysmal atrial fibrillation (HCC), Supraventricular tachycardia (HCC) (07/12/2009), and Type II diabetes mellitus (HCC) (07/12/2009).      Atrial Fibrillation with RVR  Chronic Anticoagulation  Paroxysmal Atrial Fibrillation/Flutter  -Patient reports metoprolol XL 50 mg in the morning and 50 mg at night, change made during her office visit on 5/17 by Dr. Reece Agar who  > ECHO  > EP Consult, appreciate recs, TEE/CV on 5/18  > metoprolol  > continue eliquis    Acute HFrEF, decompensated  Acute on chronic Decompensated CHF  - ECHO 02/20/21: Possible left ventricular dilatation with moderately decreased global LV contractility.  Visually estimated LVEF 35%.  > Lasix 40mg  IV BID  > ECHO    Type II DM  > continue Lantus 30U, LDCF    HLD  > continue Crestor    GERD  Continue Protonix    Anxiety/depression  Prior suicide attempt  - Continue Zoloft    FEN: no  IVF, electrolytes stable, Diet Low Sodium  Diet NPO at Midnight   PPX: eliquis  Code Status: DNAR-Full Intervention    Disposition: Admit to Medicine    Candelaria Celeste, MD  Internal Medicine      * Please Voalte message Med Private swing 3, First Call to be connected with the covering physician 24/7.     __________________________________________________________________________________  Primary Care Physician: Steva Ready  Verified    Chief Complaint: Atrial fibrillation    History of Present Illness:     Is a 75 year old female who is being admitted by Dr. Justice Britain for atrial fibrillation with RVR and acute decompensated heart failure.  Patient reports that she has not been taking her Lasix as directed.  She denies having current chest pain.  Patient states that she is intermittently sad. She denies thoughts of suicide.        Past Medical History     Medical History:   Diagnosis Date   ? Atrial flutter (HCC)     s/p ablation in 2004   ? B12 deficiency 01/13/2012   ? Chronic anticoagulation 01/14/2012   ? Diabetes mellitus (HCC) 07/12/2009   ? Hyperlipidemia 07/12/2009   ? Hypertension 07/12/2009   ? Iron deficiency anemia 01/13/2012   ? PAF (paroxysmal atrial fibrillation) (HCC) 04/12/2009   ? Paroxysmal atrial fibrillation (HCC)    ? Supraventricular tachycardia (HCC) 07/12/2009   ? Type II diabetes mellitus (HCC) 07/12/2009       Past Surgical History     Surgical History:   Procedure Laterality Date   ? HX SHOULDER SURGERY  01/03/12       Family History  Family History   Problem Relation Age of Onset   ? Stroke Mother        Social History     Social History     Socioeconomic History   ? Marital status: Married   Tobacco Use   ? Smoking status: Never Smoker   ? Smokeless tobacco: Never Used   Substance and Sexual Activity   ? Alcohol use: Yes     Comment: very rarely   ? Drug use: No        Immunizations (includes history and patient reported):   Immunization History   Administered Date(s) Administered   ? COVID-19 [facility] & JOHNSON/JANSSEN) recombinant protein vacc, 0.5 mL (PF) 02/18/2020           Allergies:  Ceftriaxone, Ciprocinonide, and Sulfa (sulfonamide antibiotics)    Medications Prior to Admission:  Prior to Admission Medications   Prescriptions Last Dose Informant Patient Reported? Taking?   LANTUS SOLOSTAR U-100 INSULIN 100 unit/mL (3 mL) injection PEN >1 Month Self Yes No   Sig: Inject 30 Units under the skin twice daily as needed.   Wheat Dextrin (BENEFIBER CLEAR) 3 gram/3.5 gram pwpk Past Week Self Yes Yes   Sig: Dissolve 2-3 tsp in drink of choice and drink daily as needed   acetaminophen (TYLENOL EXTRA STRENGTH) 500 mg tablet Past Month Self Yes Yes   Sig: Take 1,000-1,500 mg by mouth every 4 hours as needed for Pain. Max of 4,000 mg of acetaminophen in 24 hours.   albuterol 0.5% (PROVENTIL) 2.5 mg/0.5 mL nebulizer solution 04/03/2021 Self Yes Yes   Sig: Inhale 2.5 mg solution by nebulizer as directed every 6 hours as needed for Shortness of Breath or Wheezing.   alendronate (FOSAMAX) 70 mg tablet 04/03/2021 Self Yes Yes   Sig: Take 70 mg by mouth every 7 days.   apixaban (ELIQUIS) 5 mg tablet 04/03/2021 Self No Yes   Sig: Take one tablet by mouth twice daily.   calcium carbonate/vitamin D-3 (OSCAL-500+D) 1250 mg/200 unit tablet 04/03/2021 Self Yes Yes   Sig: Take 1 tablet by mouth twice daily. Calcium Carb 1250mg  delivers 500mg  elemental Ca   furosemide (LASIX) 40 mg tablet Not Taking Self No No   Sig: Take one tablet by mouth every morning.   Patient not taking: No sig reported   metoprolol XL (TOPROL XL) 50 mg extended release tablet 04/03/2021 Self No Yes   Sig: Take one tablet by mouth daily.   Patient taking differently: Take 50 mg by mouth daily. Take 1 tablet in the morning and 1/2 a tablet at night   pantoprazole DR (PROTONIX) 40 mg tablet 04/03/2021 Self Yes Yes   Sig: Take 40 mg by mouth daily.   potassium chloride (K-TAB) 10 mEq tablet 04/03/2021 Self No Yes   Sig: Take two tablets by mouth daily. Take with a meal and a full glass of water.   rosuvastatin (CRESTOR) 20 mg tablet 04/03/2021 Self No Yes   Sig: Take 1 tablet by mouth once daily   Patient taking differently: Take 20 mg by mouth at bedtime daily.   sertraline (ZOLOFT) 100 mg tablet 04/03/2021 Self Yes Yes   Sig: Take 100 mg by mouth at bedtime daily.   vitamins, multiple tablet 04/03/2021 Self Yes Yes   Sig: Take 1 Tab by mouth Daily.      Facility-Administered Medications: None       Review of Systems     System  Positives  Negatives    Constitutional:  Fever, chills, unintentional weight loss   Eyes:   Diplopia, blurry vision   ENT:   Congestion   Respiratory:   Short of breath  , cough    Cardiovascular:   Chest pain, palpitations, claudication    Gastrointestinal:   Nausea, vomiting, abdominal pain, hematemesis, melena   Genitourinary:   Dysuria    Hematologic:   Bleeding dyscrasias, easy bruising    Musculoskeletal:   Arthralgias, myalgias    Neurological:   Headache, numbness, tingling, loss of consciousness, seizures, tremors   Behavioral/Psych:  sad Suicidal ideation, hallucinations, delusions, mood swings   Skin:  Rash, itching   Immunologic:  Joint swelling   Endocrine:  No polyphagia, polydipsia       Physical Exam   Vital signs:  BP: 112/70 (05/17 1519)  Temp: 36.5 ?C (97.7 ?F) (05/17 1519)  Pulse: 111 (05/17 1519)  Respirations: 18 PER MINUTE (05/17 1519)  SpO2: 95 % (05/17 1519)  O2 Device: None (Room air) (05/17 1519)  Height: 175.3 cm (5' 9) (05/17 1335)  BP Readings from Last 6 Encounters:   04/04/21 112/70   04/04/21 118/80   03/09/21 (!) 122/90   02/20/21 116/79   02/09/21 114/77   04/26/20 132/76     Wt Readings from Last 3 Encounters:   04/04/21 (!) 137.8 kg (303 lb 12.8 oz)   04/04/21 (!) 136.8 kg (301 lb 9.6 oz)   03/09/21 135.8 kg (299 lb 6.4 oz)       General: 75 y.o. female, cooperative, no distress, appears stated age  Head: atraumatic  Eyes: Conjunctivae/corneas clear. PERRL, EOMs intact.  ENT: No gross deformities. Mucous membranes moist. Pharynx non-erythematous  Neck: Supple, symmetrical, trachea midline, no adenopathy, thyroid not enlarged, no carotid bruit and JVD not appreciated due to body habitus.   Back: Symmetric, no curvature, ROM normal.  No CVA tenderness.  Lungs: Clear to auscultation bilaterally  Chest wall: No tenderness or deformity.  Heart: Regular rate and rhythm, no murmur  Abdomen: Soft, non-tender. Bowel sounds normal. No masses. No organomegaly.  Extremities: no cyanosis or clubbing. Pulses 2+ symmetric in radial and dorsal pedis  Skin: warm, dry. No rashes or lesions  Neurologic: Alert, oriented x 3. Strength and sensation grossly conserved      Lab/Radiology/Other Diagnostic Tests:  Hematology:    Lab Results   Component Value Date    HGB 11.8 01/15/2021    HCT 37.7 01/15/2021    PLTCT 236 01/15/2021    WBC 5.5 01/15/2021    NEUT 64 01/16/2012    ANC 3.26 01/16/2012    ALC 1.35 01/16/2012    MONA 8 01/16/2012    AMC 0.41 01/16/2012    ABC 0.02 01/16/2012    MCV 97.0 01/15/2021    MCHC 31.3 01/15/2021    MPV 7.0 01/16/2012    RDW 15.8 01/15/2021   , Coagulation:    Lab Results   Component Value Date    PT 33.1 04/04/2021    PTT 32.9 04/04/2021    INR 2.8 04/04/2021    INR 2.1 08/19/2019    and General Chemistry:    Lab Results   Component Value Date    NA 136 04/03/2021    K 4.4 04/03/2021    CL 101 04/03/2021    GAP 13 04/03/2021    BUN 19.0 04/03/2021    CR 1.41 04/03/2021    GLU 168 04/03/2021    GLU 120 03/26/2004    CA 8.6 04/03/2021  ALBUMIN 3.0 01/14/2021    LACTIC 2.3 01/04/2017    MG 1.8 04/04/2021    TOTBILI 0.73 01/14/2021     FSBS (Manual): (!) 167 (04/04/21 1643)  POC Glucose (Download): (!) 167 (04/04/21 1641)  Pertinent radiology personally viewed.         Eyvonne Mechanic, MD

## 2021-04-04 NOTE — Progress Notes
!!!  STAT!!!    Request for the following medical records, for the purpose Continuity of Care.     Please send the following:      ED/Hospital Summary   EKG's    Echocardiograms     Chest X-Ray    Cardiac MRI/CT Reports   Cardiac Cath Reports/ stress tests.   Recent Lab Results       Please Fax to:   FAX#: [phone]  Attn: Ladona Ridgel, RN

## 2021-04-04 NOTE — Consults
Electrophysiology Consult Note:    Admission Date: 04/04/2021  Date of Consultation:  04/04/2021  LOS: 0 days  Requesting Physician: Golden Circle, MD  Consulting Physician:  Dr. Marya Amsler  Code Status: Prior    Reason for Consultation  Opinion and recommendations regarding atrial fib    Assessment:  Persistent atrial fibrillation  - CTI flutter ablation (2004) DCCV at end of procedure for afib  - PAF episodes documented back to 2004   - previously managed on sotalol 80 mg (stopped in 2007 for unclear reasons); prolonged QTc on 120 mg bid    - very brief course of amio (2004) prior to a-flutter ablation  - coreg changed to Toprol XL 50 mg in the am and 25 mg in the pm  - CHA2DS2-VASc Score 4 (female, age, DM, HTN), hx of PE x 2; PTA warfarin with admission INR pending    Acute systolic CHF  - EF 35% by echo 02/20/2021  - PTA lasix 40 mg daily    Newly discovered cardiomyopathy, 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  More recently persistent and now with LV dysfunction and heart failure, suspect tachycardia mediated.  Agree with admission and diuresis.  Will tentatively plan for TEE/DCCV tomorrow (ordered).  Will plan to initiate amio post DCCV given limited AAD options (QTc too long for sotalol/Tikosyn, no Multaq with HF and no 1C due to LV dysfunction).  Will plan for outpt follow up with Dr. Bradly Bienenstock and possible afib ablation as outpt.      Will continue to follow along with you.    Jene Every, NP-C (pgr [phone])  Heart [facility] (pgr [phone])      Medicare attestation    History of Present Illness:  Sandra Parks is a 75 y.o. female patient with a history of hypertension, hyperlipidemia, IDDM-II, COPD, OSAS, COPD, remote PE x 2 and atrial arrhythmias.  Patient underwent CTI flutter ablation in 2004.  She did have afib noted at that time as well with DCCV at the end of procedure.  She was treated on sotalol 80 mg bid for several years (QTc prolongation on 120 mg bid) before sotalol was DC'd for unclear reasons in 2007.  She was hospitalized in January at OSH with afib and COPD exacerbation.  She was hospitalized in February @ OSH with pneumonia and hospitalized at OSH in march with influenza.  She did follow up with Dr. Arna Medici in April and remained in afib with rates in the 110's.  Coreg was changed to Toprol XL and she returned to clinic today with evidence of persistent afib with RVR and volume overload.  She was admitted for further evaluation and treatment.    Past Medical History:  Medical History:   Diagnosis Date   ? Atrial flutter (HCC)     s/p ablation in 2004   ? B12 deficiency 01/13/2012   ? Chronic anticoagulation 01/14/2012   ? Diabetes mellitus (HCC) 07/12/2009   ? Hyperlipidemia 07/12/2009   ? Hypertension 07/12/2009   ? Iron deficiency anemia 01/13/2012   ? PAF (paroxysmal atrial fibrillation) (HCC) 04/12/2009   ? Paroxysmal atrial fibrillation (  HCC)    ? Supraventricular tachycardia (HCC) 07/12/2009   ? Type II diabetes mellitus (HCC) 07/12/2009     Social History:  Social History     Socioeconomic History   ? Marital status: Married   Tobacco Use   ? Smoking status: Never Smoker   ? Smokeless tobacco: Never Used   Substance and Sexual Activity   ? Alcohol use: Yes     Comment: very rarely   ? Drug use: No     Surgical History:  Surgical History:   Procedure Laterality Date   ? HX SHOULDER SURGERY  01/03/12     Family History:  Family History   Problem Relation Age of Onset   ? Stroke Mother        Medications:  No current facility-administered medications on file prior to encounter.     Current Outpatient Medications on File Prior to Encounter   Medication Sig Dispense Refill   ? acetaminophen (TYLENOL EXTRA STRENGTH) 500 mg tablet Take 1,000-1,500 mg by mouth every 4 hours as needed for Pain. Max of 4,000 mg of acetaminophen in 24 hours.     ? albuterol 0.5% (PROVENTIL) 2.5 mg/0.5 mL nebulizer solution Inhale 2.5 mg solution by nebulizer as directed every 6 hours as needed for Shortness of Breath or Wheezing.     ? alendronate (FOSAMAX) 70 mg tablet Take 70 mg by mouth every 7 days.     ? apixaban (ELIQUIS) 5 mg tablet Take one tablet by mouth twice daily. 180 tablet 3   ? calcium carbonate/vitamin D-3 (OSCAL-500+D) 1250 mg/200 unit tablet Take 1 tablet by mouth twice daily. Calcium Carb 1250mg  delivers 500mg  elemental Ca     ? furosemide (LASIX) 40 mg tablet Take one tablet by mouth every morning. (Patient not taking: No sig reported) 90 tablet 1   ? LANTUS SOLOSTAR U-100 INSULIN 100 unit/mL (3 mL) injection PEN Inject 30 Units under the skin twice daily as needed.     ? metoprolol XL (TOPROL XL) 50 mg extended release tablet Take one tablet by mouth daily. (Patient taking differently: Take 50 mg by mouth daily. Take 1 tablet in the morning and 1/2 a tablet at night) 90 tablet 3   ? pantoprazole DR (PROTONIX) 40 mg tablet Take 40 mg by mouth daily.     ? potassium chloride (K-TAB) 10 mEq tablet Take two tablets by mouth daily. Take with a meal and a full glass of water. 180 tablet 3   ? rosuvastatin (CRESTOR) 20 mg tablet Take 1 tablet by mouth once daily (Patient taking differently: Take 20 mg by mouth at bedtime daily.) 90 tablet 0   ? sertraline (ZOLOFT) 100 mg tablet Take 100 mg by mouth at bedtime daily.     ? vitamins, multiple tablet Take 1 Tab by mouth Daily.     ? Wheat Dextrin (BENEFIBER CLEAR) 3 gram/3.5 gram pwpk Dissolve 2-3 tsp in drink of choice and drink daily as needed       Allergies:  Allergies   Allergen Reactions   ? Ceftriaxone HIVES     Has tolerated amoxicillin per pt report   ? Ciprocinonide HIVES   ? Sulfa (Sulfonamide Antibiotics) HIVES       Review of Systems:  Const: denies recent fever, chills, or weight loss; + weight gain  Eyes:  denies changes in vision  Ears/Nose/Throat:  denies congestion or rhinorrhea  Cardiovascular:  denies chest pain or palpitations; + bipedal edema with weight gain  Respiratory: +  dyspnea, PND, + nonproductive cough  Gastrointestinal:  denies N/V/D  Genitourinary:  denies dysuria or hematuria  Musculoskeletal:  denies muscle aches or pains  Skin: denies known rashes, lesions or sores  Neurologic:  denies dizziness, lightheadedness, syncope, or near-syncope  Endocrine:  denies polyuria or polydypsia  Heme: denies recent melena or hematochezia                         Vital Signs: Most Recent                 Vital Signs: 24 Hour Range   BP: 112/70 (05/17 1519)  Temp: 36.5 ?C (97.7 ?F) (05/17 1519)  Pulse: 111 (05/17 1519)  Respirations: 18 PER MINUTE (05/17 1519)  SpO2: 95 % (05/17 1519)  O2 Device: None (Room air) (05/17 1519)  Height: 175.3 cm (5' 9) (05/17 1335) BP: (112-136)/(70-93)   Temp:  [36.5 ?C (97.7 ?F)-36.6 ?C (97.9 ?F)]   Pulse:  [111-128]   Respirations:  [18 PER MINUTE]   SpO2:  [95 %]   O2 Device: None (Room air)     Vitals:    04/04/21 1335   Weight: (!) 137.8 kg (303 lb 12.8 oz)       Intake/Output Summary (Last 24 hours) at 04/04/2021 1546  Last data filed at 04/04/2021 1519  Gross per 24 hour   Intake --   Output 0 ml   Net 0 ml           Physical Exam:  GEN: morbidly obese  75 y.o. female who appears stated age and is no acute distress  HEAD: normocephalic  EYES: sclera non icteric  MOUTH: PMMM  LUNGS: CTA bil.; + diminished posteriorly  HEART: + irregularly irregular, sl tachy  ABD: soft, obese  EXTR: 3+ bipedal edema extending to the knees bil with minimal pitting  SKIN: warm and dry  NEUR: A&Ox3  PSYCH: calm and cooperative    Labs:  Hematology:    Lab Results   Component Value Date    HGB 11.8 01/15/2021    HCT 37.7 01/15/2021    PLTCT 236 01/15/2021    WBC 5.5 01/15/2021    NEUT 64 01/16/2012    ANC 3.26 01/16/2012    ALC 1.35 01/16/2012    MONA 8 01/16/2012    AMC 0.41 01/16/2012    ABC 0.02 01/16/2012    MCV 97.0 01/15/2021    MCHC 31.3 01/15/2021    MPV 7.0 01/16/2012    RDW 15.8 01/15/2021   , Coagulation:    Lab Results   Component Value Date    PT 22.0 08/19/2019    PTT 33.3 01/16/2012    INR 3.5 01/13/2021    INR 2.1 08/19/2019   , General Chemistry:    Lab Results   Component Value Date    NA 136 04/03/2021    K 4.4 04/03/2021    CL 101 04/03/2021    GAP 13 04/03/2021    BUN 19.0 04/03/2021    CR 1.41 04/03/2021    GLU 168 04/03/2021    GLU 120 03/26/2004    CA 8.6 04/03/2021    ALBUMIN 3.0 01/14/2021    LACTIC 2.3 01/04/2017    MG 1.9 04/03/2021    TOTBILI 0.73 01/14/2021    and Endocrine:   Lab Results   Component Value Date    TSH 1.10 12/07/2019       ECG:  ECG shows afib with V-rates in  the 120's and occasional PVC's       Telemetry:  afib with V-rates in the 110's to 120's       Jene Every, NP-C [phone]

## 2021-04-05 ENCOUNTER — Encounter: Admit: 2021-04-05 | Discharge: 2021-04-05 | Payer: MEDICARE

## 2021-04-05 DIAGNOSIS — I480 Paroxysmal atrial fibrillation: Secondary | ICD-10-CM

## 2021-04-05 DIAGNOSIS — E785 Hyperlipidemia, unspecified: Secondary | ICD-10-CM

## 2021-04-05 DIAGNOSIS — I4892 Unspecified atrial flutter: Secondary | ICD-10-CM

## 2021-04-05 DIAGNOSIS — D509 Iron deficiency anemia, unspecified: Secondary | ICD-10-CM

## 2021-04-05 DIAGNOSIS — I10 Essential (primary) hypertension: Secondary | ICD-10-CM

## 2021-04-05 DIAGNOSIS — I509 Heart failure, unspecified: Secondary | ICD-10-CM

## 2021-04-05 DIAGNOSIS — I471 Supraventricular tachycardia: Secondary | ICD-10-CM

## 2021-04-05 DIAGNOSIS — E538 Deficiency of other specified B group vitamins: Secondary | ICD-10-CM

## 2021-04-05 DIAGNOSIS — Z7901 Long term (current) use of anticoagulants: Secondary | ICD-10-CM

## 2021-04-05 DIAGNOSIS — E119 Type 2 diabetes mellitus without complications: Secondary | ICD-10-CM

## 2021-04-05 MED ADMIN — POTASSIUM CHLORIDE 20 MEQ PO TBTQ [35943]: 40 meq | ORAL | @ 16:00:00 | Stop: 2021-04-05 | NDC 00245531989

## 2021-04-05 MED ADMIN — SODIUM CHLORIDE 0.9 % IV SOLP [27838]: 250 mL | INTRAVENOUS | @ 16:00:00 | Stop: 2021-04-05 | NDC 00338004902

## 2021-04-05 MED ADMIN — MAGNESIUM SULFATE IN D5W 1 GRAM/100 ML IV PGBK [166578]: 1 g | INTRAVENOUS | @ 16:00:00 | Stop: 2021-04-05 | NDC 00409672723

## 2021-04-06 ENCOUNTER — Inpatient Hospital Stay: Admit: 2021-04-06 | Discharge: 2021-04-06 | Payer: MEDICARE

## 2021-04-06 MED ORDER — SODIUM CHLORIDE 0.9 % IV SOLP (OR) 500ML
INTRAVENOUS | 0 refills | Status: DC
Start: 2021-04-06 — End: 2021-04-06
  Administered 2021-04-06: 18:00:00 via INTRAVENOUS

## 2021-04-06 MED ORDER — PROPOFOL INJ 10 MG/ML IV VIAL
INTRAVENOUS | 0 refills | Status: DC
Start: 2021-04-06 — End: 2021-04-06
  Administered 2021-04-06: 18:00:00 20 mg via INTRAVENOUS
  Administered 2021-04-06: 18:00:00 30 mg via INTRAVENOUS

## 2021-04-06 MED ORDER — LIDOCAINE (PF) 20 MG/ML (2 %) IJ SOLN
INTRAVENOUS | 0 refills | Status: DC
Start: 2021-04-06 — End: 2021-04-06
  Administered 2021-04-06: 18:00:00 40 mg via INTRAVENOUS

## 2021-04-06 MED ORDER — PROPOFOL 10 MG/ML IV EMUL 20 ML (INFUSION)(AM)(OR)
INTRAVENOUS | 0 refills | Status: DC
Start: 2021-04-06 — End: 2021-04-06
  Administered 2021-04-06: 18:00:00 120 ug/kg/min via INTRAVENOUS

## 2021-04-06 MED ADMIN — RIVAROXABAN 20 MG PO TAB [309661]: 20 mg | ORAL | @ 22:00:00 | NDC 50458057901

## 2021-04-06 MED ADMIN — MAGNESIUM SULFATE IN D5W 1 GRAM/100 ML IV PGBK [166578]: 1 g | INTRAVENOUS | @ 19:00:00 | Stop: 2021-04-06 | NDC 00338170940

## 2021-04-06 MED ADMIN — PERFLUTREN LIPID MICROSPHERES 1.1 MG/ML IV SUSP [79178]: 3 mL | INTRAVENOUS | @ 17:00:00 | Stop: 2021-04-06 | NDC 11994001116

## 2021-04-06 MED ADMIN — ASPIRIN 81 MG PO TBEC [14113]: 81 mg | ORAL | @ 22:00:00 | NDC 00904678370

## 2021-04-06 NOTE — Anesthesia Post-Procedure Evaluation
Post-Anesthesia Evaluation    Name: Sandra Parks      MRN: [id number]     DOB: December 05, 1945     Age: 75 y.o.     Sex: female   __________________________________________________________________________     Procedure Information     Anesthesia Start Date/Time: 04/06/21 1239    Scheduled providers: Leane Platt, MD    Procedure: TRANSESOPHAGEAL ECHO    Location: Cardiology:[facility]          Post-Anesthesia Vitals      Blood pressure 117/73, pulse 98, temperature 36.5 C (97.7 F), height 175.3 cm (5\' 9" ), weight (!) 137.4 kg (303 lb), SpO2 92 %.          Post Anesthesia Evaluation Note    Evaluation location: Pre/Post  Patient participation: recovered; patient participated in evaluation  Level of consciousness: alert    Pain score: 1  Pain management: adequate    Hydration: normovolemia  Temperature: 36.0C - 38.4C  Airway patency: adequate    Perioperative Events       Post-op nausea and vomiting: no PONV    Postoperative Status  Cardiovascular status: hemodynamically stable  Respiratory status: spontaneous ventilation and supplemental oxygen  Follow-up needed: none        Perioperative Events  There were no known complications for this encounter.

## 2021-04-06 NOTE — Anesthesia Pre-Procedure Evaluation
Anesthesia Pre-Procedure Evaluation    Name: Sandra Parks      MRN: [id number]     DOB: 12/17/1945     Age: 75 y.o.     Sex: female   _________________________________________________________________________     Procedure Info:   Procedure Information     Date/Time: 04/06/21 1200    Scheduled providers: Leane Platt, MD    Procedure: TRANSESOPHAGEAL ECHO    Location: Cardiology:[facility]          Physical Assessment  Vital Signs (last filed in past 24 hours):  BP: 132/78 (05/19 0755)  Temp: 36.5 ?C (97.7 ?F) (05/19 8295)  Pulse: 90 (05/19 0755)  Respirations: 18 PER MINUTE (05/19 0755)  SpO2: 93 % (05/19 0755)  O2 Device: None (Room air) (05/19 0755)  Weight: 42.5 kg (93 lb 12.8 oz) (05/19 0845)      Patient History   Allergies   Allergen Reactions   ? Ceftriaxone HIVES     Has tolerated amoxicillin per pt report   ? Ciprocinonide HIVES   ? Sulfa (Sulfonamide Antibiotics) HIVES        Current Medications    Medication Directions   acetaminophen (TYLENOL EXTRA STRENGTH) 500 mg tablet Take 1,000-1,500 mg by mouth every 4 hours as needed for Pain. Max of 4,000 mg of acetaminophen in 24 hours.   albuterol 0.5% (PROVENTIL) 2.5 mg/0.5 mL nebulizer solution Inhale 2.5 mg solution by nebulizer as directed every 6 hours as needed for Shortness of Breath or Wheezing.   alendronate (FOSAMAX) 70 mg tablet Take 70 mg by mouth every 7 days.   apixaban (ELIQUIS) 5 mg tablet Take one tablet by mouth twice daily.   calcium carbonate/vitamin D-3 (OSCAL-500+D) 1250 mg/200 unit tablet Take 1 tablet by mouth twice daily. Calcium Carb 1250mg  delivers 500mg  elemental Ca   furosemide (LASIX) 40 mg tablet Take one tablet by mouth every morning.  Patient not taking: No sig reported   LANTUS SOLOSTAR U-100 INSULIN 100 unit/mL (3 mL) injection PEN Inject 30 Units under the skin twice daily as needed.   metoprolol XL (TOPROL XL) 50 mg extended release tablet Take one tablet by mouth daily.  Patient taking differently: Take 50 mg by mouth twice daily.   pantoprazole DR (PROTONIX) 40 mg tablet Take 40 mg by mouth daily.   potassium chloride (K-TAB) 10 mEq tablet Take two tablets by mouth daily. Take with a meal and a full glass of water.   rosuvastatin (CRESTOR) 20 mg tablet Take 1 tablet by mouth once daily  Patient taking differently: Take 20 mg by mouth at bedtime daily.   sertraline (ZOLOFT) 100 mg tablet Take 100 mg by mouth at bedtime daily.   vitamins, multiple tablet Take 1 Tab by mouth Daily.   Wheat Dextrin (BENEFIBER CLEAR) 3 gram/3.5 gram pwpk Dissolve 2-3 tsp in drink of choice and drink daily as needed         Review of Systems/Medical History      Patient summary reviewed  Nursing notes reviewed  Pertinent labs reviewed    PONV Screening: Female gender and Non-smoker  No history of anesthetic complications  No family history of anesthetic complications        Pulmonary       Not a current smoker        Asthma    no COPD      No recent URI      No sleep apnea  Cardiovascular         Exercise tolerance: <4 METS       Beta Blocker therapy: Yes      Beta blockers within 24 hours: Yes        No CIED                                                                                              Hypertension,       No hx of coronary artery disease        Dysrhythmias; atrial fibrillation      CHF      Hyperlipidemia      GI/Hepatic/Renal           GERD, well controlled      No hx of liver disease       Renal disease: CRI       No nausea      Neuro/Psych       No seizures      No CVA      No indications/hx of neuropathy      Weakness      Musculoskeletal         Neck pain      Back pain      Endocrine/Other       Diabetes, type 2; using insulin      Anemia      No malignancy      Obesity   Physical Exam    Airway Findings      Mallampati: III      TM distance: >3 FB      Neck ROM: full      Mouth opening: good      Airway patency: adequate    Dental Findings: Negative      Cardiovascular Findings:       Rhythm: irregular Rate: normal      No murmur    Pulmonary Findings:    Decreased breath sounds.    Abdominal Findings:       Obese    Neurological Findings:       Alert and oriented x 3    Constitutional findings:       No acute distress      Well-developed      Well-nourished       Diagnostic Tests  Hematology:   Lab Results   Component Value Date    HGB 10.7 04/06/2021    HCT 33.4 04/06/2021    PLTCT 222 04/06/2021    WBC 4.0 04/06/2021    NEUT 49 04/06/2021    ANC 1.99 04/06/2021    ALC 1.56 04/06/2021    MONA 11 04/06/2021    AMC 0.43 04/06/2021    EOSA 0 04/06/2021    ABC 0.05 04/06/2021    MCV 81.3 04/06/2021    MCH 25.9 04/06/2021    MCHC 31.9 04/06/2021    MPV 9.0 04/06/2021    RDW 19.3 04/06/2021         General Chemistry:   Lab Results   Component Value Date    NA 141 04/06/2021    K  4.2 04/06/2021    CL 98 04/06/2021    CO2 35 04/06/2021    GAP 8 04/06/2021    BUN 17 04/06/2021    CR 1.38 04/06/2021    GLU 77 04/06/2021    GLU 120 03/26/2004    CA 9.0 04/06/2021    ALBUMIN 3.1 04/06/2021    LACTIC 2.3 01/04/2017    MG 1.7 04/06/2021    TOTBILI 1.3 04/06/2021    PO4 4.8 01/07/2012      Coagulation:   Lab Results   Component Value Date    PT 33.1 04/04/2021    PTT 32.9 04/04/2021    INR 2.8 04/04/2021    INR 2.1 08/19/2019         Anesthesia Plan    ASA score: 3   Plan: MAC  Induction method: intravenous  NPO status: acceptable      Informed Consent  Anesthetic plan and risks discussed with patient.  Use of blood products discussed with patient  Blood Consent: consented      Plan discussed with: anesthesiologist, CRNA and SRNA.

## 2021-04-07 ENCOUNTER — Encounter: Admit: 2021-04-07 | Discharge: 2021-04-07 | Payer: MEDICARE

## 2021-04-07 DIAGNOSIS — I513 Intracardiac thrombosis, not elsewhere classified: Secondary | ICD-10-CM

## 2021-04-07 DIAGNOSIS — I480 Paroxysmal atrial fibrillation: Secondary | ICD-10-CM

## 2021-04-07 DIAGNOSIS — Z8679 Personal history of other diseases of the circulatory system: Secondary | ICD-10-CM

## 2021-04-07 MED ADMIN — SPIRONOLACTONE 25 MG PO TAB [7437]: 25 mg | ORAL | @ 13:00:00 | Stop: 2021-04-07 | NDC 63739054410

## 2021-04-07 MED ADMIN — DAPAGLIFLOZIN 10 MG PO TAB [320172]: 10 mg | ORAL | @ 18:00:00 | Stop: 2021-04-07 | NDC 00310621030

## 2021-04-07 MED ADMIN — MAGNESIUM SULFATE IN WATER 4 GRAM/50 ML (8 %) IV PGBK [166563]: 4 g | INTRAVENOUS | @ 15:00:00 | Stop: 2021-04-07 | NDC 00264420552

## 2021-04-07 MED ADMIN — MAGNESIUM OXIDE 400 MG (241.3 MG MAGNESIUM) PO TAB [10491]: 400 mg | ORAL | @ 15:00:00 | Stop: 2021-04-07 | NDC 10006070028

## 2021-04-07 MED ADMIN — METOPROLOL SUCCINATE 50 MG PO TB24 [77931]: 75 mg | ORAL | @ 13:00:00 | Stop: 2021-04-07 | NDC 00904632361

## 2021-04-07 MED ADMIN — FUROSEMIDE 20 MG PO TAB [3294]: 20 mg | ORAL | @ 13:00:00 | Stop: 2021-04-07 | NDC 51079007201

## 2021-04-07 MED ADMIN — METOPROLOL SUCCINATE 50 MG PO TB24 [77931]: 75 mg | ORAL | @ 03:00:00 | NDC 00904632361

## 2021-04-07 MED ADMIN — METOPROLOL SUCCINATE 25 MG PO TB24 [81866]: 75 mg | ORAL | @ 03:00:00 | NDC 00904632261

## 2021-04-07 MED ADMIN — ASPIRIN 81 MG PO TBEC [14113]: 81 mg | ORAL | @ 13:00:00 | Stop: 2021-04-07 | NDC 00904678370

## 2021-04-07 MED ADMIN — METOPROLOL SUCCINATE 25 MG PO TB24 [81866]: 75 mg | ORAL | @ 13:00:00 | Stop: 2021-04-07 | NDC 00904632261

## 2021-04-07 MED ADMIN — POTASSIUM CHLORIDE 20 MEQ PO TBTQ [35943]: 20 meq | ORAL | @ 15:00:00 | Stop: 2021-04-07 | NDC 00245531989

## 2021-04-07 MED FILL — DAPAGLIFLOZIN 10 MG PO TAB: 10 mg | ORAL | 30 days supply | Qty: 30 | Fill #1 | Status: CP

## 2021-04-07 NOTE — Progress Notes
Your scheduled Procedure: TEE/DCCV  Procedure Date:05/15/21  Procedure Arrival Time: 11:00 AM office visit with Dr. Bradly Bienenstock, procedure to follow at 1245 PM    You will need to be fasting after midnight on the night prior to the procedure(nothing to eat or drink)  You must have a driver present to take you home from the test  You will receive a call the day or 2 prior to the procedure to further instruct you about medication hold and restrictions.   Call us back with any questions at [phone]

## 2021-04-10 ENCOUNTER — Encounter: Admit: 2021-04-10 | Discharge: 2021-04-10 | Payer: MEDICARE

## 2021-04-10 NOTE — Telephone Encounter
.  Patient Discharge Date from hospital: 04/07/21  Date Call Attempted: 04/10/21  Number of Attempts: 1  Date Call Completed: 04/10/21     Next Appointment    Next follow-up appointment on 04/14/21 at 1400 with Fara Boros, PA-C at our [facility] clinic.    Transportation    Yes     Home Health    [facility]) [phone]     Medications    Pt states she has all her medications and has no questions at this time.    Diet    200 mg cholesterol, 2 G Na    Scale/Weight    Pt states she is not sure if her scales work because they are very old. Education provided.     Signs and Symptoms    Pt reports the following symptoms:     Pt states she is still not feeling well. She reports BLE and abdominal bloating are no better since discharge. Pt has weeping to RLE. Pt states she has not had weeping in her legs before. HH measured pt for compression stockings today. Pt reports she is still having SOA at rest, DOE. Pt states she sometimes has PND.     Pt verbalized understanding of signs and symptoms of HF and when to contact a provider or seek immediate assistance at the ER.    Intervention(s)    Advised pt to elevate BLE above heart level 2-3 times daily for 20-30 minutes. Reviewed s/s of Lymphedema and advised pt to have HH contact PCP if sx occur.     Pt educated on the importance of weighing daily first thing in the morning after voiding using the same scale in the same location and write results down in note pad or log. Notify us for weight gains of 3 lbs in one day or 5 lbs in one week. Notify us for increased SOA.  Notify us for swelling or increased swelling in BLE or abdominal fullness/bloating. Call 911 for sudden, severe chest/pain pressure/SOA develops. Be sure to make your follow up appointment and bring your weight logs, B/P logs, and medication list with you to your appointment. Call us at [phone] if you have any questions.     Plan of Care    Continued education needed for heart failure symptom management and when to contact our office.

## 2021-04-13 NOTE — Progress Notes
Date of Service: 04/14/2021    Sandra Parks is a 75 y.o. female.       HPI       Hospital discharge follow-up  Sandra Parks  is a 75 y.o.  female  followed by Dr. Arna Medici  She presents for a post-hospitalization follow up today.    Her discharge summary was reviewed, and a thorough medication reconciliation was undertaken.    Reviewed hospital course with patient. Reviewed follow-up plan with patient. Reviewed current symptoms with patient.     Date of admission: 04/04/21  Date of discharge: 04/07/21  Date of Transition of Care Phone Call: 04/10/21  Today's Date:  04/14/21     A/P as below:   Sandra Parks is a 75 y.o. 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    ?  She was admitted to [facility] on 04/04/2021 after she saw Dr. Arna Medici in clinic in [place].  She is in A. fib with RVR and acute on chronic heart failure.  She had not been taking loop diuretic Lasix.  Echo with newly reduced LVEF of 35% down from 55-60% at baseline. She was diuresed with IV lasix and d/c on lasix 20 mg daily. Unable to complete DCCV due to evidence of LV thrombus. Converted from eliquis to Xarelto with plans for TEE/DCCV with Dr Bradly Bienenstock on 05/15/21. Stress test as outpatient after cardioversion and repeat assessment of LVEF when out of afib.     She presented today reporting that she is concerned about her ongoing swelling in her legs which has been worse all of a sudden for about a month. She does not think it is continuing to get worse but not going away either. She reports some incontinence of urine and poor compliance with daily weights due to her incontinence. She reports that her HR is consistently > 100 bpm but not > 120s and she does not feel it racing. She has intermittent heaviness in her chest and does feel SOB. No orthopnea or PND. Mild occasional lightheadedness but not near syncope. No apparent bleeding on Xarelto except for chronically mild epistaxis. She is wearing oxygen 2 L at night only. She thinks her dry weight before the swelling started was 285 pounds.          Vitals:    04/14/21 1401   PainSc: Four   Weight: 133 kg (293 lb 3.2 oz)   Height: 175.3 cm (5' 9)     Body mass index is 43.3 kg/m?Marland Kitchen   Wt Readings from Last 3 Encounters:   04/14/21 133 kg (293 lb 3.2 oz)   04/07/21 131.5 kg (290 lb)   04/04/21 (!) 136.8 kg (301 lb 9.6 oz)        Past Medical History  Patient Active Problem List    Diagnosis Date Noted   ? Acute combined systolic and diastolic heart failure (HCC) 04/06/2021   ? Atrial fibrillation (HCC) 04/04/2021   ? Congestive heart failure, unspecified (HCC) 03/09/2021   ? 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 History of pulmonary embolism 05/28/2012   ? Syncope 01/14/2012   ? History of atrial flutter 01/14/2012     2004: RFA     ? AKI (acute kidney injury) (HCC) 01/14/2012   ? Chronic anticoagulation 01/14/2012   ? Iron  deficiency anemia 01/13/2012   ? B12 deficiency 01/13/2012   ? Bilateral pulmonary embolism (HCC) 01/08/2012   ? Hypertension 07/12/2009   ? Supraventricular tachycardia (HCC) 07/12/2009   ? Hyperlipidemia 07/12/2009   ? Type II diabetes mellitus (HCC) 07/12/2009   ? PAF (paroxysmal atrial fibrillation) (HCC) 04/12/2009         ROS * see HPI      Physical Exam  Alert and oriented with appropriate communication skills  JVD 10 cm at 90 degrees with (+) HJR  No abdominal distention  2+ bil LE edema  Breathing comfortably without wheezing, rales or rhonchi  Auscultated heart with normal S1S2, no obvious murmur or extra heart sounds or rub, tachycardic and irregularly irregular  Able to walk into clinic unassisted      Cardiovascular Studies  04/06/21 Echocardiogram  ? Normal left ventricular size and wall thickness with moderately reduced systolic function. LVEF=35%.  ? There is apical swirling of contrast without clear evidence of LV thrombus.  ? Regional wall motion abnormalities as depicted in the diagram below.  ? The right ventricle is moderately dilated with mild to moderately reduced contractility.  ? Severely dilated right atrium. ?Mildly dilated right atrium.  ? Normal central venous pressure (0-5 mmHg).  ? Estimated pulmonary artery systolic pressure is 34?mmHg.  ? Mitral annular calcification and leaflet thickening without stenosis. ?Moderate MR  ? Dilated tricuspid annulus with moderate to severe regurgitation.  ? Sclerotic and calcified aortic valve without significant stenosis or regurgitation.  ? Trivial pericardial effusion adjacent to the LV free wall. ?Left pleural effusion was incidentally noted.      Problems Addressed Today  No diagnosis found.    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   - h/o FenPhen use in 1998  - Last LVEF 35% from echo 04/06/21  - NYHA class III Stage C  - Dry weight: 285 pounds (?)   - hypervolemic on exam today  > PLAN:   - will stop lasix and start bumex 1 mg BID, cont Farxiga and spironolactone  - start losartan 25 mg daily for GDMT  - repeat BMP in a week    GDMT Current Dose Changes made at visit   BB Toprol XL 75 mg BID    ACEI/ARB/ARNI  Losartan 25 mg daily   Aldosterone antagonist Spironolactone 25 mg daily    SGLT2i Farxiga 10 mg daily     Hydralazine/Nitrate     Ivabradine NA    HRMT  NA, newly reduced in setting of afib with RVR    Diuretics Lasix 20 mg daily    Cardiac rehab       Atrial Fibrillation, Persistent:  SVT:   S/p CTI flutter ablation 2004, prev on sotalol and brief course of amiodarone  A/c with rivaroxaban 20 mg daily (prev on apixaban with new evidence of poss LV thrombus therefore unable to safely complete DCCV during hospital stay)   Treated with Toprol XL (dose increased for HR control)   Follow up with Dr Bradly Bienenstock for planned DCCV/TEE on 05/15/21  > check TSH, repeat ECG today with afib, vent rate 110    Hypertension  BP today 110/75  Currently on  GDMT as per above    Chronic hypoxia  H/o PE  She wears nocturnal oxygen 2 L nightly for at least 2 years per patient.  -Per patient sleep apnea testing has been negative  A/c with Xarelto 20 mg daily    Chronic  Kidney Disease - Stage III:  -Baseline creatinine 1.1-1.4  - Last creat 1.45 from 04/07/21  - POC creat today 1.4 with K+ 4.0    DM Type II   > cont on Farxiga (new start) and lantus  Lab Results   Component Value Date/Time    HGBA1C 8.2 (H) 04/05/2021 04:13 AM    HGBA1C 8.0 (H) 12/07/2019 12:00 AM    HGBA1C 6.9 05/27/2012 12:00 AM        Dyslipidemia  > cont on rosuvastatin 20 mg QHS  Lab Results   Component Value Date/Time    CHOL 66 04/06/2021 04:34 AM    TRIG 79 04/06/2021 04:34 AM    HDL 18 (L) 04/06/2021 04:34 AM    LDL 43 04/06/2021 04:34 AM    VLDL 16 04/06/2021 04:34 AM    NONHDLCHOL 48 04/06/2021 04:34 AM    CHOLHDLC 4 12/07/2019 12:00 AM      H/o Fe Def Anemia  H/H 10.2/31.6 upon discharge, iron panel not checked  Last iron panel 2013 with Ferritin 33 and Tsat 9%  > repeat iron panel         Current Medications (including today's revisions)  ? acetaminophen (TYLENOL EXTRA STRENGTH) 500 mg tablet Take 1,000-1,500 mg by mouth every 4 hours as needed for Pain. Max of 4,000 mg of acetaminophen in 24 hours.   ? albuterol 0.5% (PROVENTIL) 2.5 mg/0.5 mL nebulizer solution Inhale 2.5 mg solution by nebulizer as directed every 6 hours as needed for Shortness of Breath or Wheezing.   ? alendronate (FOSAMAX) 70 mg tablet Take 70 mg by mouth every 7 days.   ? aspirin EC 81 mg tablet Take one tablet by mouth daily. Take with food.   ? calcium carbonate/vitamin D-3 (OSCAL-500+D) 1250 mg/200 unit tablet Take 1 tablet by mouth twice daily. Calcium Carb 1250mg  delivers 500mg  elemental Ca   ? dapagliflozin (FARXIGA) 10 mg tablet Take one tablet by mouth daily.   ? furosemide (LASIX) 40 mg tablet Take one-half tablet by mouth every morning.   ? insulin glargine (LANTUS SOLOSTAR U-100 INSULIN) 100 unit/mL (3 mL) subcutaneous PEN Inject twenty Units under the skin at bedtime daily.   ? magnesium oxide (MAGOX) 400 mg (241.3 mg magnesium) tablet Take one tablet by mouth twice daily.   ? metoprolol XL (TOPROL XL) 50 mg extended release tablet Take 1.5 tablets by mouth twice daily.   ? pantoprazole DR (PROTONIX) 40 mg tablet Take 40 mg by mouth daily.   ? potassium chloride SR (K-DUR) 20 mEq tablet Take 1 tablet by mouth daily. Take with a meal and a full glass of water.   ? rivaroxaban (XARELTO) 20 mg tablet Take one tablet by mouth daily with dinner. Take with food.   ? rosuvastatin (CRESTOR) 20 mg tablet Take 1 tablet by mouth once daily (Patient taking differently: Take 20 mg by mouth at bedtime daily.)   ? sertraline (ZOLOFT) 100 mg tablet Take 100 mg by mouth at bedtime daily.   ? spironolactone (ALDACTONE) 25 mg tablet Take one tablet by mouth daily. Take with food.   ? vitamins, multiple tablet Take 1 Tab by mouth Daily.   ? Wheat Dextrin (BENEFIBER CLEAR) 3 gram/3.5 gram pwpk Dissolve 2-3 tsp in drink of choice and drink daily as needed            Total Time Today was 45 minutes in the following activities: Preparing to see the patient, Obtaining and/or reviewing separately obtained history, Performing a  medically appropriate examination and/or evaluation, Counseling and educating the patient/family/caregiver, Ordering medications, tests, or procedures, Referring and communication with other health care professionals (when not separately reported) and Documenting clinical information in the electronic or other health record     Sue Lush, APRN, FNP-C, CHFN  Advanced Heart Failure APP  [facility]  Phone [phone]  Fax [phone]  [email or web address]  [address], [address]  [place], [place] [address]    Collaborating physician Renita Papa, MD

## 2021-04-14 ENCOUNTER — Encounter: Admit: 2021-04-14 | Discharge: 2021-04-14 | Payer: MEDICARE

## 2021-04-14 ENCOUNTER — Ambulatory Visit: Admit: 2021-04-14 | Discharge: 2021-04-15 | Payer: MEDICARE

## 2021-04-14 DIAGNOSIS — E119 Type 2 diabetes mellitus without complications: Secondary | ICD-10-CM

## 2021-04-14 DIAGNOSIS — I4892 Unspecified atrial flutter: Secondary | ICD-10-CM

## 2021-04-14 DIAGNOSIS — Z7901 Long term (current) use of anticoagulants: Secondary | ICD-10-CM

## 2021-04-14 DIAGNOSIS — I10 Essential (primary) hypertension: Secondary | ICD-10-CM

## 2021-04-14 DIAGNOSIS — I471 Supraventricular tachycardia: Secondary | ICD-10-CM

## 2021-04-14 DIAGNOSIS — Z86711 Personal history of pulmonary embolism: Secondary | ICD-10-CM

## 2021-04-14 DIAGNOSIS — D509 Iron deficiency anemia, unspecified: Secondary | ICD-10-CM

## 2021-04-14 DIAGNOSIS — E785 Hyperlipidemia, unspecified: Secondary | ICD-10-CM

## 2021-04-14 DIAGNOSIS — E538 Deficiency of other specified B group vitamins: Secondary | ICD-10-CM

## 2021-04-14 DIAGNOSIS — I480 Paroxysmal atrial fibrillation: Secondary | ICD-10-CM

## 2021-04-14 MED ORDER — LOSARTAN 25 MG PO TAB
25 mg | ORAL_TABLET | Freq: Every day | ORAL | 1 refills | 30.00000 days | Status: AC
Start: 2021-04-14 — End: ?

## 2021-04-14 MED ORDER — BUMETANIDE 1 MG PO TAB
1 mg | ORAL_TABLET | Freq: Two times a day (BID) | ORAL | 1 refills | Status: AC
Start: 2021-04-14 — End: ?

## 2021-04-14 NOTE — Patient Instructions
1. Med changes today: convert lasix to bumex 1 mg BID and start losartan 25 mg daily  2. Procedures planned: TEE/cardioversion planned 05/15/21  3. Please log your weight, blood pressure, and heart rate and bring to all visits.  4. Labs in 1 week: BMP, CBC, Iron panel and TSH  5. Continue with 2 liter/day or 64 ounces/day fluid restriction and 2 gm (2000 mg) sodium restriction/day  6. Return to see Fara Boros, NP in 2 weeks    Please call the office with any questions or concerns [phone] (nurse triage).  To schedule or change an appointment call [phone].      Sue Lush, [facility], APRN-NP  Nurse Practitioner    Advanced Heart Failure, Silver Team  Kathleen Argue, MD   Bing Matter, MD  Nurse Practitioners: Satira Anis, Fara Boros, Huma Baig and [name]  RNs: Nida Boatman, Mick Sell, Dawn Clearance Coots, [facility] at [facility] Phone: [phone] Fax: [phone]    Your Heart Failure Symptom Awareness and Action Plan  Every Day Action Plan  ? Weigh yourself in the morning before breakfast. Write it down and compare it to yesterday's weight.  ? Take your medicine, as prescribed. Please call if you have concerns about the side effects, cost or refills.  ? Check for worsened swelling in your feet, ankles and stomach  ? Follow a 2 gm (2000 mg) salt diet.  ? Keep all healthcare appointments    Green Zone   Good! Symptoms are under control ? No shortness of breath  ? No increase in ankle swelling  ? No weight gain  ? No chest pain  ? No change in your usual activity  ? Continue to follow your everyday action plan.    Yellow Zone  If you have any of these symptoms, please call the heart failure nurses: [phone] ? Increased shortness of breath with activity  ? Weight gain of 3 pounds in one day or 5 pounds in a week  ? Increased swelling in your ankles or legs  ? Increased swelling in your stomach  ? Increasing fatigue  ? You may need an adjustment of your medications.    Red Zone  These are urgent symptoms. Please call the heart failure nurses: [phone] ? Shortness of breath at rest or waking up at night feeling short of breath or coughing  ? Increased number of pillows used or needing to sit upright to sleep  ? Chest tightness at rest  ? Dizziness, lightheadedness or feeling faint You need to schedule an appointment   Emergency Zone - call 911 ? Worsening chest tightness or pain that is not relieved by medication  ? Severe shortness of breath and a cough with pink, frothy sputum

## 2021-04-15 DIAGNOSIS — I5041 Acute combined systolic (congestive) and diastolic (congestive) heart failure: Secondary | ICD-10-CM

## 2021-04-15 DIAGNOSIS — I10 Essential (primary) hypertension: Secondary | ICD-10-CM

## 2021-04-27 ENCOUNTER — Encounter: Admit: 2021-04-27 | Discharge: 2021-04-27 | Payer: MEDICARE

## 2021-05-03 ENCOUNTER — Encounter: Admit: 2021-05-03 | Discharge: 2021-05-03 | Payer: MEDICARE

## 2021-05-03 ENCOUNTER — Ambulatory Visit: Admit: 2021-05-03 | Discharge: 2021-05-04 | Payer: MEDICARE

## 2021-05-03 DIAGNOSIS — E785 Hyperlipidemia, unspecified: Secondary | ICD-10-CM

## 2021-05-03 DIAGNOSIS — I4892 Unspecified atrial flutter: Secondary | ICD-10-CM

## 2021-05-03 DIAGNOSIS — Z86711 Personal history of pulmonary embolism: Secondary | ICD-10-CM

## 2021-05-03 DIAGNOSIS — I471 Supraventricular tachycardia: Secondary | ICD-10-CM

## 2021-05-03 DIAGNOSIS — D509 Iron deficiency anemia, unspecified: Secondary | ICD-10-CM

## 2021-05-03 DIAGNOSIS — E538 Deficiency of other specified B group vitamins: Secondary | ICD-10-CM

## 2021-05-03 DIAGNOSIS — Z7901 Long term (current) use of anticoagulants: Secondary | ICD-10-CM

## 2021-05-03 DIAGNOSIS — I10 Essential (primary) hypertension: Secondary | ICD-10-CM

## 2021-05-03 DIAGNOSIS — E119 Type 2 diabetes mellitus without complications: Secondary | ICD-10-CM

## 2021-05-03 DIAGNOSIS — I480 Paroxysmal atrial fibrillation: Secondary | ICD-10-CM

## 2021-05-03 NOTE — Progress Notes
Date of Service: 05/03/2021    Sandra Parks is a 75 y.o. female.       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 ?  ?  She was admitted to [facility] on 04/04/2021 after she saw Dr. Arna Medici in clinic in [place]. ?She is in A. fib with RVR and acute on chronic heart failure. ?She had not been taking loop diuretic Lasix. ?Echo with newly reduced LVEF of 35% down from 55-60% at baseline. She was diuresed with IV lasix and d/c on lasix 20 mg daily. Unable to complete DCCV due to evidence of LV thrombus. Converted from eliquis to Xarelto with plans for TEE/DCCV with Dr Bradly Bienenstock on 05/15/21. Stress test as outpatient after cardioversion and repeat assessment of LVEF when out of afib.     He was last seen by Fara Boros, NP on 04/14/21 at which time lasix was converted to bumex 1 mg BID and she was started on losartan 25 mg daily.     Pt presents today reporting she is feeling overall lousy. She has ongoing chest discomfort that is worse with activity, she describes as heaviness and states it is not bad and not worse than two weeks ago, she is just getting weary of feeling this way. She has SOB with minimal exertion. She has been diuresing well and notes her weight loss, but overall DOE is not better. HR is running 90-110s at home, no faster than 120 bpm. She is not dizzy. She did have trouble getting up from toilet one day and ended up falling forward when trying to get up. She was able to get off floor with help from her grandson, no apparent syncope or injury, just weakness getting up from a low sitting position. She has also had constipation, improved with stool softeners. She has not been restricting her fluid intake. She has had mild epistaxis, but no blood in her stool other than mild streaks on TP when wiping.She does have easy bruising.     She is having a stress test in [facility] tomorrow.     Patient denies dizziness, syncope,orthopnea or PND.          Vitals:    05/03/21 1414   BP: 121/71   BP Source: Arm, Left Lower   Pulse: (!) 132   SpO2: 97%   PainSc: Zero   Weight: 125.9 kg (277 lb 9.6 oz)   Height: 175.3 cm (5' 9)     Body mass index is 40.99 kg/m?Marland Kitchen   Wt Readings from Last 3 Encounters:   05/03/21 125.9 kg (277 lb 9.6 oz)   04/14/21 133 kg (293 lb 3.2 oz)   04/07/21 131.5 kg (290 lb)        Past Medical History  Patient Active Problem List    Diagnosis Date Noted   ? Chronic combined systolic and diastolic heart failure (HCC) 04/06/2021   ? Atrial fibrillation (HCC) 04/04/2021   ? Congestive heart failure, unspecified (HCC) 03/09/2021   ? 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 History of pulmonary embolism 05/28/2012   ? Syncope 01/14/2012   ? History of atrial flutter 01/14/2012     2004: RFA     ? AKI (acute kidney injury) (HCC) 01/14/2012   ?  Chronic anticoagulation 01/14/2012   ? Iron deficiency anemia 01/13/2012   ? B12 deficiency 01/13/2012   ? Bilateral pulmonary embolism (HCC) 01/08/2012   ? Hypertension 07/12/2009   ? Supraventricular tachycardia (HCC) 07/12/2009   ? Hyperlipidemia 07/12/2009   ? Type II diabetes mellitus (HCC) 07/12/2009   ? Persistent atrial fibrillation (HCC) 04/12/2009         ROS * see HPI      Physical Exam  Alert and oriented with appropriate communication skills  JVD difficult to assess with obese body habitus  No abdominal distention  1-2+ LE edema, chronic lymphedema  Breathing comfortably without wheezing, rales or rhonchi  Auscultated heart with normal S1S2, irregularly irregular rhythm and tachycardia, no obvious murmur or extra heart sounds  Able to walk into clinic unassisted      Cardiovascular Studies  04/06/21 Echocardiogram  ? Normal left ventricular size and wall thickness with moderately reduced systolic function. LVEF=35%.  ? There is apical swirling of contrast without clear evidence of LV thrombus.  ? Regional wall motion abnormalities as depicted in the diagram below.  ? The right ventricle is moderately dilated with mild to moderately reduced contractility.  ? Severely dilated right atrium. ?Mildly dilated right atrium.  ? Normal central venous pressure (0-5 mmHg).  ? Estimated pulmonary artery systolic pressure is 34?mmHg.  ? Mitral annular calcification and leaflet thickening without stenosis. ?Moderate MR  ? Dilated tricuspid annulus with moderate to severe regurgitation.  ? Sclerotic and calcified aortic valve without significant stenosis or regurgitation.  ? Trivial pericardial effusion adjacent to the LV free wall. ?Left pleural effusion was incidentally noted        Problems Addressed Today  Encounter Diagnoses   Name Primary?   ? Persistent atrial fibrillation (HCC) Yes   ? Chronic combined systolic and diastolic heart failure (HCC)    ? 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 Plan to be more dry than wet going into DCCV to improve effectiveness  GDMT Current Dose Changes made at visit   BB Toprol XL 75 mg BID     ACEI/ARB/ARNI Losartan 25 mg daily     Aldosterone antagonist Spironolactone 25 mg daily     SGLT2i Farxiga 10 mg daily      Hydralazine/Nitrate       Ivabradine NA    HRMT  NA, newly reduced LVEF in setting of afib with RVR No ICD - LVEF > 35%   Diuretics Bumex 1 mg BID     Cardiac rehab  NA while in afib     Atrial Fibrillation, Persistent:  SVT:   S/p CTI flutter ablation 2004, prev on sotalol and brief course of amiodarone  A/c with rivaroxaban 20 mg daily (prev on apixaban with new evidence of poss LV thrombus therefore unable to safely complete DCCV during hospital stay)   Treated with Toprol XL (dose increased for HR control)  Follow up with Dr Bradly Bienenstock for planned DCCV/TEE on 05/15/21  > repeat ECG today with afib, vent rate 110  ?  Hypertension  BP today 121/71  Currently on  GDMT as per above  ?  Chronic hypoxia  H/o PE  She wears nocturnal oxygen 2 L nightly for at least 2 years per patient.  -Per patient sleep apnea testing has been negative  A/c with Xarelto 20 mg daily  - no apparent bleeding, reports compliance with dosing  ?  Chronic Kidney Disease - Stage?III:  -Baseline creatinine 1.1-1.4  - Last creat 1.5 from 04/14/21 after starting bumex    H/o Fe Def Anemia  H/H 10.2/31.6 upon discharge, iron panel not checked  Last iron panel 2013 with Ferritin 33 and Tsat 9%  > repeat iron panel      DM Type II   > cont on Farxiga (new start) and lantus  Lab Results   Component Value Date/Time    HGBA1C 8.2 (H) 04/05/2021 04:13 AM    HGBA1C 8.0 (H) 12/07/2019 12:00 AM    HGBA1C 6.9 05/27/2012 12:00 AM        Dyslipidemia  > cont on rosuvastatin 20 mg QHS  Lab Results   Component Value Date/Time    CHOL 66 04/06/2021 04:34 AM    TRIG 79 04/06/2021 04:34 AM    HDL 18 (L) 04/06/2021 04:34 AM    LDL 43 04/06/2021 04:34 AM    VLDL 16 04/06/2021 04:34 AM    NONHDLCHOL 48 04/06/2021 04:34 AM    CHOLHDLC 4 12/07/2019 12:00 AM               Current Medications (including today's revisions)  ? acetaminophen (TYLENOL EXTRA STRENGTH) 500 mg tablet Take 1,000-1,500 mg by mouth every 4 hours as needed for Pain. Max of 4,000 mg of acetaminophen in 24 hours.   ? albuterol 0.5% (PROVENTIL) 2.5 mg/0.5 mL nebulizer solution Inhale 2.5 mg solution by nebulizer as directed every 6 hours as needed for Shortness of Breath or Wheezing.   ? alendronate (FOSAMAX) 70 mg tablet Take 70 mg by mouth every 7 days. ? aspirin EC 81 mg tablet Take one tablet by mouth daily. Take with food.   ? bumetanide (BUMEX) 1 mg tablet Take one tablet by mouth twice daily.   ? calcium carbonate/vitamin D-3 (OSCAL-500+D) 1250 mg/200 unit tablet Take 1 tablet by mouth twice daily. Calcium Carb 1250mg  delivers 500mg  elemental Ca   ? dapagliflozin (FARXIGA) 10 mg tablet Take one tablet by mouth daily.   ? insulin glargine (LANTUS SOLOSTAR U-100 INSULIN) 100 unit/mL (3 mL) subcutaneous PEN Inject twenty Units under the skin at bedtime daily.   ? losartan (COZAAR) 25 mg tablet Take one tablet by mouth daily.   ? magnesium oxide (MAGOX) 400 mg (241.3 mg magnesium) tablet Take one tablet by mouth twice daily.   ? metoprolol XL (TOPROL XL) 50 mg extended release tablet Take 1.5 tablets by mouth twice daily.   ? pantoprazole DR (PROTONIX) 40 mg tablet Take 40 mg by mouth daily.   ? potassium chloride SR (K-DUR) 20 mEq tablet Take 1 tablet by mouth daily. Take with a meal and a full glass of water.   ? rivaroxaban (XARELTO) 20 mg tablet Take one tablet by mouth daily with dinner. Take with food.   ? rosuvastatin (CRESTOR) 20 mg tablet Take 1 tablet by mouth once daily (Patient taking differently: Take 20 mg by mouth at bedtime daily.)   ?  sertraline (ZOLOFT) 100 mg tablet Take 100 mg by mouth at bedtime daily.   ? spironolactone (ALDACTONE) 25 mg tablet Take one tablet by mouth daily. Take with food.   ? vitamins, multiple tablet Take 1 Tab by mouth Daily.   ? Wheat Dextrin (BENEFIBER CLEAR) 3 gram/3.5 gram pwpk Dissolve 2-3 tsp in drink of choice and drink daily as needed            Total Time Today was 30 minutes in the following activities: Preparing to see the patient, Obtaining and/or reviewing separately obtained history, Performing a medically appropriate examination and/or evaluation, Counseling and educating the patient/family/caregiver, Ordering medications, tests, or procedures, Referring and communication with other health care professionals (when not separately reported) and Documenting clinical information in the electronic or other health record     Sue Lush, APRN, NP-C, CHFN  Advanced Heart Failure APP  [facility]  Phone [phone]  Fax [phone]  [email or web address]  [address]  [place], [place] [address]    Collaborating physician Renita Papa, MD

## 2021-05-03 NOTE — Patient Instructions
1. Med changes today:  none  2. Procedures planned: Cardioversion 05/15/21  3. Please log your weight, blood pressure, and heart rate and bring to all visits.  4. Labs in 3 days: BMP (print for draw in [place])   5. Continue with 2 liter/day or 64 ounces/day fluid restriction and 2 gm (2000 mg) sodium restriction/day  6. Return to see Fara Boros, NP in 1 month (mid July)     Please call the office with any questions or concerns [phone] (nurse triage).  To schedule or change an appointment call [phone].      Sue Lush, [facility], APRN-NP  Nurse Practitioner    Advanced Heart Failure, Silver Team  Kathleen Argue, MD   Bing Matter, MD  Nurse Practitioners: Satira Anis, Fara Boros, Huma Baig and [name]  RNs: Nida Boatman, Mick Sell, Dawn Clearance Coots, [facility] at [facility] Phone: [phone] Fax: [phone]    Your Heart Failure Symptom Awareness and Action Plan  Every Day Action Plan  ? Weigh yourself in the morning before breakfast. Write it down and compare it to yesterday's weight.  ? Take your medicine, as prescribed. Please call if you have concerns about the side effects, cost or refills.  ? Check for worsened swelling in your feet, ankles and stomach  ? Follow a 2 gm (2000 mg) salt diet.  ? Keep all healthcare appointments    Green Zone   Good! Symptoms are under control ? No shortness of breath  ? No increase in ankle swelling  ? No weight gain  ? No chest pain  ? No change in your usual activity  ? Continue to follow your everyday action plan.    Yellow Zone  If you have any of these symptoms, please call the heart failure nurses: [phone] ? Increased shortness of breath with activity  ? Weight gain of 3 pounds in one day or 5 pounds in a week  ? Increased swelling in your ankles or legs  ? Increased swelling in your stomach  ? Increasing fatigue  ? You may need an adjustment of your medications.    Red Zone  These are urgent symptoms. Please call the heart failure nurses: [phone] ? Shortness of breath at rest or waking up at night feeling short of breath or coughing  ? Increased number of pillows used or needing to sit upright to sleep  ? Chest tightness at rest  ? Dizziness, lightheadedness or feeling faint You need to schedule an appointment   Emergency Zone - call 911 ? Worsening chest tightness or pain that is not relieved by medication  ? Severe shortness of breath and a cough with pink, frothy sputum

## 2021-05-03 NOTE — Telephone Encounter
Discussed stress test instructions for her appointment on Friday including date, time, and location with patient. Instructions included the following:    NO DECAF OR CAFFEINE 24 HOURS PRIOR TO TEST. Examples: coffee, tea, decaf coffee or tea, cola, chocolate.     DO NOT EAT OR DRINK THE MORNING OF YOUR TEST unless otherwise instructed. (You may have a couple sips of water.) If you are a diabetic, if insulin dependent: please take one third of your insulin with a light breakfast (two pieces of dry toast and a small juice). Bring insulin and medication with you to the test.     TAKE MORNING MEDICATIONS WITH A COUPLE SIPS OF WATER PRIOR TO TEST.     Hold all vitamins and supplements on the morning of your test.   Reminder-this is a two part test, with a couple hour break in between imaging.    Phone number provided for any additional questions or concerns.    Pt verbalized understanding. No questions at this time.

## 2021-05-04 DIAGNOSIS — I5042 Chronic combined systolic (congestive) and diastolic (congestive) heart failure: Secondary | ICD-10-CM

## 2021-05-04 DIAGNOSIS — I4819 Other persistent atrial fibrillation: Principal | ICD-10-CM

## 2021-05-05 ENCOUNTER — Encounter: Admit: 2021-05-05 | Discharge: 2021-05-05 | Payer: MEDICARE

## 2021-05-05 DIAGNOSIS — I480 Paroxysmal atrial fibrillation: Secondary | ICD-10-CM

## 2021-05-05 DIAGNOSIS — I10 Essential (primary) hypertension: Secondary | ICD-10-CM

## 2021-05-05 MED ORDER — RP DX TL-201 THALLOUS CHL MCI
1 | Freq: Once | INTRAVENOUS | 0 refills | Status: AC
Start: 2021-05-05 — End: ?

## 2021-05-05 MED ORDER — AMINOPHYLLINE 500 MG/20 ML IV SOLN
50 mg | INTRAVENOUS | 0 refills | Status: AC | PRN
Start: 2021-05-05 — End: ?

## 2021-05-05 MED ORDER — ALBUTEROL SULFATE 90 MCG/ACTUATION IN HFAA
2 | RESPIRATORY_TRACT | 0 refills | Status: AC | PRN
Start: 2021-05-05 — End: ?

## 2021-05-05 MED ORDER — SODIUM CHLORIDE 0.9 % IV SOLP
250 mL | INTRAVENOUS | 0 refills | Status: AC | PRN
Start: 2021-05-05 — End: ?

## 2021-05-05 MED ORDER — EUCALYPTUS-MENTHOL MM LOZG
1 | Freq: Once | ORAL | 0 refills | Status: AC | PRN
Start: 2021-05-05 — End: ?

## 2021-05-05 MED ORDER — RP DX TL-201 THALLOUS CHL MCI
3.5 | Freq: Once | INTRAVENOUS | 0 refills | Status: CP
Start: 2021-05-05 — End: ?

## 2021-05-05 MED ORDER — REGADENOSON 0.4 MG/5 ML IV SYRG
.4 mg | Freq: Once | INTRAVENOUS | 0 refills | Status: CP
Start: 2021-05-05 — End: ?

## 2021-05-05 MED ORDER — NITROGLYCERIN 0.4 MG SL SUBL
.4 mg | SUBLINGUAL | 0 refills | Status: AC | PRN
Start: 2021-05-05 — End: ?

## 2021-05-09 ENCOUNTER — Encounter: Admit: 2021-05-09 | Discharge: 2021-05-09 | Payer: MEDICARE

## 2021-05-10 ENCOUNTER — Encounter: Admit: 2021-05-10 | Discharge: 2021-05-10 | Payer: MEDICARE

## 2021-05-10 NOTE — Telephone Encounter
-----   Message from Steven B Gollub, MD sent at 05/09/2021  5:08 PM CDT -----  Steve and Kelsey: please give Ms. Patnaude a preliminary report of her regadenoson thallium stress test and then Dr. Pimentel can review with her in detail when she sees her in clinic on 05/15/2021.  Rhea: Thanks again for seeing her. Steve G.

## 2021-05-10 NOTE — Telephone Encounter
Prelim results giving informed Sandra Parks will review in greater detail.

## 2021-05-10 NOTE — Telephone Encounter
-----   Message from Hester Mates, MD sent at 05/09/2021  5:08 PM CDT -----  Cletis Athens: please give Ms. Delancey a preliminary report of her regadenoson thallium stress test and then Dr. Bradly Bienenstock can review with her in detail when she sees her in clinic on 05/15/2021.  Rhea: Thanks again for seeing her. Dustin Flock

## 2021-05-11 ENCOUNTER — Encounter: Admit: 2021-05-11 | Discharge: 2021-05-11 | Payer: MEDICARE

## 2021-05-11 MED FILL — DAPAGLIFLOZIN 10 MG PO TAB: 10 mg | ORAL | 30 days supply | Qty: 30 | Fill #2 | Status: AC

## 2021-05-12 ENCOUNTER — Encounter: Admit: 2021-05-12 | Discharge: 2021-05-12 | Payer: MEDICARE

## 2021-05-12 NOTE — Telephone Encounter
Spoke with pt regarding upcoming procedure.     This is a reminder call that Sandra Parks is scheduled to have a TEE/Cardioversion on Monday, June 27th, 2022,  at 1:45pm.  You have an appointment with Dr. Ladean Raya at 11am. Please check in at 10:45am at the main cardiology desk inside the lobby of [facility].    Please do not eat or drink after midnight except for sips of water ONLY with morning medications which should include all heart medications such as: blood pressure medications, antiarrhythmic and blood thinners if you normally take those in the morning. Please HOLD these medications the morning of your test: Bumex, Aldactone, vitamins, probiotics, supplements.        You must have a driver present or available to drive you home. Avoid applying lotions or oils to your skin the day of your test.  Remove any metal jewelry prior to your procedure.  If you have any questions please feel free to contact your Cardiovascular procedure nurses at [phone].

## 2021-05-15 ENCOUNTER — Encounter: Admit: 2021-05-15 | Discharge: 2021-05-15 | Payer: MEDICARE

## 2021-05-15 ENCOUNTER — Ambulatory Visit: Admit: 2021-05-15 | Discharge: 2021-05-15 | Payer: MEDICARE

## 2021-05-15 DIAGNOSIS — I513 Intracardiac thrombosis, not elsewhere classified: Secondary | ICD-10-CM

## 2021-05-15 DIAGNOSIS — Z86711 Personal history of pulmonary embolism: Secondary | ICD-10-CM

## 2021-05-15 DIAGNOSIS — I5042 Chronic combined systolic (congestive) and diastolic (congestive) heart failure: Secondary | ICD-10-CM

## 2021-05-15 DIAGNOSIS — E119 Type 2 diabetes mellitus without complications: Secondary | ICD-10-CM

## 2021-05-15 DIAGNOSIS — I4892 Unspecified atrial flutter: Secondary | ICD-10-CM

## 2021-05-15 DIAGNOSIS — E538 Deficiency of other specified B group vitamins: Secondary | ICD-10-CM

## 2021-05-15 DIAGNOSIS — I4819 Other persistent atrial fibrillation: Secondary | ICD-10-CM

## 2021-05-15 DIAGNOSIS — D509 Iron deficiency anemia, unspecified: Secondary | ICD-10-CM

## 2021-05-15 DIAGNOSIS — I480 Paroxysmal atrial fibrillation: Secondary | ICD-10-CM

## 2021-05-15 DIAGNOSIS — Z8679 Personal history of other diseases of the circulatory system: Secondary | ICD-10-CM

## 2021-05-15 DIAGNOSIS — I10 Essential (primary) hypertension: Secondary | ICD-10-CM

## 2021-05-15 DIAGNOSIS — I471 Supraventricular tachycardia: Secondary | ICD-10-CM

## 2021-05-15 DIAGNOSIS — E785 Hyperlipidemia, unspecified: Secondary | ICD-10-CM

## 2021-05-15 DIAGNOSIS — Z7901 Long term (current) use of anticoagulants: Secondary | ICD-10-CM

## 2021-05-15 LAB — LIVER FUNCTION PANEL
ALBUMIN: 3.3 g/dL — ABNORMAL LOW (ref 3.5–5.0)
ALK PHOSPHATASE: 67 U/L (ref 25–110)
ALT: 8 U/L (ref 7–56)
AST: 14 U/L (ref 7–40)
DIRECT BILIRUBIN: 0.4 mg/dL — ABNORMAL HIGH (ref <0.4)
TOTAL BILIRUBIN: 1.2 mg/dL — AB (ref 0.3–1.2)
TOTAL PROTEIN: 6.8 g/dL (ref 6.0–8.0)

## 2021-05-15 LAB — TSH WITH FREE T4 REFLEX: TSH: 2.1 uU/mL (ref 0.35–5.00)

## 2021-05-15 MED ORDER — ONDANSETRON IVPB
4 mg | Freq: Once | INTRAVENOUS | 0 refills | PRN
Start: 2021-05-15 — End: ?

## 2021-05-15 MED ORDER — FENTANYL CITRATE (PF) 50 MCG/ML IJ SOLN
50 ug | INTRAVENOUS | 0 refills | PRN
Start: 2021-05-15 — End: ?

## 2021-05-15 MED ORDER — SODIUM CHLORIDE 0.9 % IV SOLP (OR) 500ML
INTRAVENOUS | 0 refills | Status: DC
Start: 2021-05-15 — End: 2021-05-15
  Administered 2021-05-15: 19:00:00 via INTRAVENOUS

## 2021-05-15 MED ORDER — OXYCODONE 5 MG PO TAB
5-10 mg | Freq: Once | ORAL | 0 refills | PRN
Start: 2021-05-15 — End: ?

## 2021-05-15 MED ORDER — PROPOFOL 10 MG/ML IV EMUL 20 ML (INFUSION)(AM)(OR)
INTRAVENOUS | 0 refills | Status: DC
Start: 2021-05-15 — End: 2021-05-15
  Administered 2021-05-15: 19:00:00 100 ug/kg/min via INTRAVENOUS

## 2021-05-15 MED ORDER — AMIODARONE 400 MG PO TAB
400 mg | ORAL_TABLET | Freq: Two times a day (BID) | ORAL | 1 refills | 42.00000 days | Status: AC
Start: 2021-05-15 — End: ?

## 2021-05-15 MED ORDER — PROPOFOL INJ 10 MG/ML IV VIAL
INTRAVENOUS | 0 refills | Status: DC
Start: 2021-05-15 — End: 2021-05-15
  Administered 2021-05-15 (×2): 50 mg via INTRAVENOUS

## 2021-05-15 MED ORDER — GLYCOPYRROLATE 0.2 MG/ML IJ SOLN
INTRAVENOUS | 0 refills | Status: DC
Start: 2021-05-15 — End: 2021-05-15
  Administered 2021-05-15 (×2): .1 mg via INTRAVENOUS

## 2021-05-15 MED ORDER — LIDOCAINE (PF) 20 MG/ML (2 %) IJ SOLN
INTRAVENOUS | 0 refills | Status: DC
Start: 2021-05-15 — End: 2021-05-15
  Administered 2021-05-15: 19:00:00 80 mg via INTRAVENOUS

## 2021-05-15 MED ORDER — MEPERIDINE (PF) 100 MG/ML IJ SYRG
12.5 mg | INTRAVENOUS | 0 refills | PRN
Start: 2021-05-15 — End: ?

## 2021-05-15 NOTE — Progress Notes
Date of Service: 05/15/2021    Sandra Parks is a 75 y.o. female.       HPI     Sandra Parks presents to my office today in electrophysiology hospital follow-up for a history of atrial arrhythmias.  As you know, she is a pleasant 75 year old female with a past medical history of persistent atrial fibrillation, hypertension, hyperlipidemia, supraventricular tachycardia, diabetes mellitus, and morbid obesity who is typically followed by my colleague Dr. Dion Saucier.  Dr. Justice Britain last saw Sandra Parks in the office in May.  At that time, he reviewed a recent hospital admission where she was seen for acute on chronic heart failure.  I met Sandra Parks at that time.  She was in atrial fibrillation with a rapid ventricular rate since at least mid January.  She had multiple contraindications to antiarrhythmic drugs.  Ultimately, we decided to proceed with TEE guided cardioversion and initiation of amiodarone.    Unfortunately, TEE was difficult to obtain and she did have evidence of a left atrial appendage clot thus no cardioversion was performed.  She was switched from Eliquis to Xarelto.    Sandra Parks recently saw one of the heart failure nurse practitioners.  At that time, she was complaining of significant shortness of breath on exertion and fatigue. Sandra Parks is in my office today, prior to planned TEE guided cardioversion.  She tells me that she is doing okay.  She is still short of breath and extremely tired.  She cannot feel her heart racing.  Sandra Parks is noted increased lower extremity edema but denies PND or orthopnea.  She has been drinking more water which is a sign for her that she is breathing through her mouth at night when she wears her oxygen.  In regards to family history, she tells me that she has a father and 2 brothers with A. fib.  Her younger brother had a pacemaker and one other brother died of his atrial fibrillation.  She herself denies any missing any anticoagulation.    Sandra Parks is somewhat concerned about a recent CT scan of the chest that was done to look at pulmonary nodules.  She tells me that she has a PET scan pending.  Recent thallium stress test showed normal LV function and no evidence of ischemia with stress.       Vitals:    05/15/21 1053 05/15/21 1055   BP:  124/76   BP Source:  Arm, Left Upper   Pulse:  (!) 124   SpO2:  97%   O2 Device: Nasal cannula    O2 Liter Flow: 2 Lpm  Comment: at night    PainSc:  Zero   Weight:  125.6 kg (276 lb 12.8 oz)   Height:  175.3 cm (5' 9)     Body mass index is 40.88 kg/m?Marland Kitchen     Past Medical History  Patient Active Problem List    Diagnosis Date Noted   ? Thrombus of left atrial appendage 05/09/2021     04/06/2021 - TEE:  There is dense spontaneous echo contrast and a small sludge/forming thrombus at the apex of the left atrial appendage. LAA emptying velocity=20 cm/s.  Left atrium is mildly dilated.  Right atrium is severely dilated.  No color Doppler evidence of interatrial shunting.  Normal LV size with moderately reduced LV systolic function.  Visually estimated LVEF = 35%.  Right ventricle is moderately dilated with mildly reduced contractility.  Estimated PASP = 29 mmHg + RA pressure.  Mild  mitral annular calcification with mild thickening/calcification of leaflets and chordae.  No stenosis.  Moderate MR.  Dilated tricuspid annulus with severe tricuspid regurgitation associated with reversed hepatic venous systolic flow.  Sclerotic and calcified aortic valve without significant stenosis or regurgitation.  No significant pericardial effusion.  Left pleural effusion was incidentally noted.         ? Chronic combined systolic and diastolic heart failure (HCC) 04/06/2021   ? Atrial fibrillation (HCC) 04/04/2021   ? Congestive heart failure, unspecified (HCC) 03/09/2021   ? 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 History of pulmonary embolism 05/28/2012   ? Syncope 01/14/2012   ? History of atrial flutter 01/14/2012     2004: RFA     ? AKI (acute kidney injury) (HCC) 01/14/2012   ? Chronic anticoagulation 01/14/2012   ? Iron deficiency anemia 01/13/2012   ? B12 deficiency 01/13/2012   ? Bilateral pulmonary embolism (HCC) 01/08/2012   ? Hypertension 07/12/2009   ? Supraventricular tachycardia (HCC) 07/12/2009   ? Hyperlipidemia 07/12/2009   ? Type II diabetes mellitus (HCC) 07/12/2009   ? Persistent atrial fibrillation (HCC) 04/12/2009         Review of Systems   Constitutional: Negative.   HENT: Negative.    Eyes: Negative.    Cardiovascular: Positive for irregular heartbeat (Afib).   Respiratory: Positive for shortness of breath.    Endocrine: Negative.    Hematologic/Lymphatic: Negative.    Skin: Negative.    Musculoskeletal: Negative.    Gastrointestinal: Negative.    Genitourinary: Negative.    Neurological: Negative.    Psychiatric/Behavioral: Negative.    Allergic/Immunologic: Negative.        Physical Exam  General Appearance: morbidly obese, well nourished in no acute distress  Skin: warm, moist, no ulcers  HEENT: extraocular movements intact, oropharynx clear  Neck Veins: neck veins are flat, neck veins are not distended  Carotid Arteries: normal carotid upstroke bilaterally, no bruits  Chest Inspection: chest is normal in appearance  Auscultation/Percussion: decreased breath sounds throughout, no rales, rhonchi, or wheezing  Cardiac Rhythm: irregularly irregular rhythm and fast rate  Cardiac Auscultation: Normal S1 & S2, no S3 or S4, no rub  Murmurs: no cardiac murmurs   Extremities: 3+ lower extremity edema to knees bilaterally; 1+ symmetric distal pulses  Abdominal Exam: soft, non-tender, no masses, bowel sounds normal  Liver & Spleen: no organomegaly  Neurologic Exam: neurological assessment grossly intact      Cardiovascular Studies  12 lead EKG:  Atrial fibrillation, ventricular rate 119 bpm, QT 3965 msec, QRSd 95 msec    Cardiovascular Health Factors  Vitals BP Readings from Last 3 Encounters:   05/15/21 124/76   05/03/21 121/71   04/14/21 110/75     Wt Readings from Last 3 Encounters:   05/15/21 125.6 kg (276 lb 12.8 oz)   05/03/21 125.9 kg (277 lb 9.6 oz)   04/14/21 133 kg (293 lb 3.2 oz)     BMI Readings from Last 3 Encounters:   05/15/21 40.88 kg/m?   05/03/21 40.99 kg/m?   04/14/21 43.30 kg/m?      Smoking Social History     Tobacco Use   Smoking Status Never Smoker   Smokeless Tobacco Never Used      Lipid Profile Cholesterol   Date Value Ref Range Status   04/06/2021 66 <200 MG/DL Final     HDL   Date Value Ref Range Status  04/06/2021 18 (L) >40 MG/DL Final     LDL   Date Value Ref Range Status   04/06/2021 43 <100 mg/dL Final     Triglycerides   Date Value Ref Range Status   04/06/2021 79 <150 MG/DL Final      Blood Sugar Hemoglobin A1C   Date Value Ref Range Status   04/05/2021 8.2 (H) 4.0 - 6.0 % Final     Comment:     The ADA recommends that most patients with type 1 and type 2 diabetes maintain   an A1c level <7%.       Glucose   Date Value Ref Range Status   04/07/2021 87 70 - 100 MG/DL Final   16/08/9603 77 70 - 100 MG/DL Final   54/07/8118 147 (H) 70 - 100 MG/DL Final   82/95/6213 086 (H) 70 - 110 MG/DL Final   57/84/6962 952 (H) 70 - 110 MG/DL Final     Glucose, POC   Date Value Ref Range Status   04/14/2021 163 (H) 70 - 100 MG/DL Final   84/13/2440 102 (H) 70 - 100 MG/DL Final   72/53/6644 66 (L) 70 - 100 MG/DL Final   03/47/4259 563 (H) 70 - 100 MG/DL Final          Problems Addressed Today  Encounter Diagnoses   Name Primary?   ? Persistent atrial fibrillation (HCC) Yes   ? History of atrial flutter    ? Chronic combined systolic and diastolic heart failure (HCC)    ? History of pulmonary embolism        Assessment and Plan       Persistent atrial fibrillation (HCC)  Unfortunately, she remains in atrial fibrillation today with a rapid ventricular response.  When I first met her 6 weeks ago, she was in A. fib and acute heart failure.  Unfortunately, cardioversion could not be performed due to the presence of a left atrial appendage clot.    I am concerned that Sandra Parks is at high risk for redeveloping heart failure given her uncontrolled atrial fibrillation.  She is due to undergo TEE guided cardioversion today.  If there remains a persistent thrombus, then I would recommend we proceed with AV node ablation and biventricular defibrillator implantation.  I have discussed this with the patient at length.  We discussed the risks and benefits of the procedure including the fact that she will become essentially pacemaker dependent after this.  Sandra Parks is agreeable to proceeding as she has had multiple family members with atrial fibrillation who have died subsequently.    If she does convert successfully back to sinus rhythm, I would initiate amiodarone 400 mg twice daily.    Chronic combined systolic and diastolic heart failure (HCC)  She has a history of chronic combined systolic and diastolic heart failure.  I suspect most of this is secondary to her persistent atrial fibrillation with uncontrolled ventricular rates.  She may need diuresis after cardioversion.  On exam, she has significant lower extremity edema but denies PND or orthopnea.    History of pulmonary embolism  Patient is on anticoagulation chronically for her history of pulmonary embolus.  I suspect that this is contributing to her lower extremity edema.    We will keep you up to date with results of procedures as they occur.    Current Medications (including today's revisions)  ? acetaminophen (TYLENOL EXTRA STRENGTH) 500 mg tablet Take 1,000-1,500 mg by mouth every 4 hours as needed for Pain. Max  of 4,000 mg of acetaminophen in 24 hours.   ? albuterol 0.5% (PROVENTIL) 2.5 mg/0.5 mL nebulizer solution Inhale 2.5 mg solution by nebulizer as directed every 6 hours as needed for Shortness of Breath or Wheezing.   ? alendronate (FOSAMAX) 70 mg tablet Take 70 mg by mouth every 7 days.   ? aspirin EC 81 mg tablet Take one tablet by mouth daily. Take with food.   ? bumetanide (BUMEX) 1 mg tablet Take one tablet by mouth twice daily.   ? calcium carbonate/vitamin D-3 (OSCAL-500+D) 1250 mg/200 unit tablet Take 1 tablet by mouth twice daily. Calcium Carb 1250mg  delivers 500mg  elemental Ca   ? dapagliflozin (FARXIGA) 10 mg tablet Take one tablet by mouth daily.   ? insulin glargine (LANTUS SOLOSTAR U-100 INSULIN) 100 unit/mL (3 mL) subcutaneous PEN Inject twenty Units under the skin at bedtime daily.   ? losartan (COZAAR) 25 mg tablet Take one tablet by mouth daily.   ? magnesium oxide (MAGOX) 400 mg (241.3 mg magnesium) tablet Take one tablet by mouth twice daily.   ? metoprolol XL (TOPROL XL) 50 mg extended release tablet Take 1.5 tablets by mouth twice daily.   ? pantoprazole DR (PROTONIX) 40 mg tablet Take 40 mg by mouth daily.   ? potassium chloride SR (K-DUR) 20 mEq tablet Take 1 tablet by mouth daily. Take with a meal and a full glass of water.   ? rivaroxaban (XARELTO) 20 mg tablet Take one tablet by mouth daily with dinner. Take with food.   ? rosuvastatin (CRESTOR) 20 mg tablet Take 1 tablet by mouth once daily (Patient taking differently: Take 20 mg by mouth at bedtime daily.)   ? sertraline (ZOLOFT) 100 mg tablet Take 100 mg by mouth at bedtime daily.   ? spironolactone (ALDACTONE) 25 mg tablet Take one tablet by mouth daily. Take with food.   ? vitamins, multiple tablet Take 1 Tab by mouth Daily.   ? Wheat Dextrin (BENEFIBER CLEAR) 3 gram/3.5 gram pwpk Dissolve 2-3 tsp in drink of choice and drink daily as needed

## 2021-05-15 NOTE — Patient Instructions
CARDIOLOGY PROCEDURES           POST SEDATION INSTRUCTIONS      Patient Name: Sandra Parks  MRN#: [id number]  Date: 05/15/2021      Please follow the instructions listed below:    1. Resume diet as tolerated. Continue all medications as prescribed. Do NOT miss any doses of your anticoagulation for 30 days.    ? The following day you may experience a minor sore throat.    4. Please have someone accompany you, as YOU SHOULD NOT drive or operate machinery for at least 12-24 hours following the procedure.    ? There may be some residual effects from the sedatives during the procedure.  ? Do not drive a vehicle for up to 24 hours after receiving sedation.  ? Do not operate heavy or potentially harmful equipment  ? Do not make legally binding decisions  ? Do not drink alcohol for up to 24 hours  ? Do not communicate through social media for 24 hours.    5. Other instructions: If burns occur to chest and back - use aloe for sunburn.     6. If you have question or concerns about this procedure, please contact the Cardiology office at [phone], and ask to speak to one of the nurses.      Current Medications List:  ? acetaminophen (TYLENOL EXTRA STRENGTH) 500 mg tablet Take 1,000-1,500 mg by mouth every 4 hours as needed for Pain. Max of 4,000 mg of acetaminophen in 24 hours.   ? albuterol 0.5% (PROVENTIL) 2.5 mg/0.5 mL nebulizer solution Inhale 2.5 mg solution by nebulizer as directed every 6 hours as needed for Shortness of Breath or Wheezing.   ? alendronate (FOSAMAX) 70 mg tablet Take 70 mg by mouth every 7 days.   ? aspirin EC 81 mg tablet Take one tablet by mouth daily. Take with food.   ? bumetanide (BUMEX) 1 mg tablet Take one tablet by mouth twice daily.   ? calcium carbonate/vitamin D-3 (OSCAL-500+D) 1250 mg/200 unit tablet Take 1 tablet by mouth twice daily. Calcium Carb 1250mg  delivers 500mg  elemental Ca   ? dapagliflozin (FARXIGA) 10 mg tablet Take one tablet by mouth daily.   ? insulin glargine (LANTUS SOLOSTAR U-100 INSULIN) 100 unit/mL (3 mL) subcutaneous PEN Inject twenty Units under the skin at bedtime daily.   ? losartan (COZAAR) 25 mg tablet Take one tablet by mouth daily.   ? magnesium oxide (MAGOX) 400 mg (241.3 mg magnesium) tablet Take one tablet by mouth twice daily.   ? metoprolol XL (TOPROL XL) 50 mg extended release tablet Take 1.5 tablets by mouth twice daily.   ? pantoprazole DR (PROTONIX) 40 mg tablet Take 40 mg by mouth daily.   ? potassium chloride SR (K-DUR) 20 mEq tablet Take 1 tablet by mouth daily. Take with a meal and a full glass of water.   ? rivaroxaban (XARELTO) 20 mg tablet Take one tablet by mouth daily with dinner. Take with food.   ? rosuvastatin (CRESTOR) 20 mg tablet Take 1 tablet by mouth once daily (Patient taking differently: Take 20 mg by mouth at bedtime daily.)   ? sertraline (ZOLOFT) 100 mg tablet Take 100 mg by mouth at bedtime daily.   ? spironolactone (ALDACTONE) 25 mg tablet Take one tablet by mouth daily. Take with food.   ? vitamins, multiple tablet Take 1 Tab by mouth Daily.   ? Wheat Dextrin (BENEFIBER CLEAR) 3 gram/3.5 gram pwpk Dissolve 2-3  tsp in drink of choice and drink daily as needed         Instructions Given To: Pt, Pt husband    Instructions Given By: Estanislado Spire, RN

## 2021-05-15 NOTE — Anesthesia Post-Procedure Evaluation
Post-Anesthesia Evaluation    Name: Sandra Parks      MRN: [id number]     DOB: [birth year]     Age: 75 y.o.     Sex: female   __________________________________________________________________________     Procedure Information     Anesthesia Start Date/Time: 05/15/21 1340    Scheduled providers: Oscar La, MD    Procedure: TRANSESOPHAGEAL ECHO    [facility]          Post-Anesthesia Vitals  BP: 108/67 (06/27 1438)  Pulse: 58 (06/27 1438)  Respirations: 18 PER MINUTE (06/27 1438)  SpO2: 95 % (06/27 1438)  O2 Device: None (Room air) (06/27 1438)  Height: 175.3 cm (5\' 9" ) (06/27 1412)   Vitals Value Taken Time   BP 108/67 05/15/21 1438   Temp     Pulse 58 05/15/21 1438   Respirations 18 PER MINUTE 05/15/21 1438   SpO2 95 % 05/15/21 1438   O2 Device None (Room air) 05/15/21 1438   ABP     ART BP           Post Anesthesia Evaluation Note    Evaluation location: pre/post  Patient participation: recovered; patient participated in evaluation  Level of consciousness: alert    Pain score: 1  Pain management: adequate    Hydration: normovolemia  Temperature: 36.0C - 38.4C  Airway patency: adequate    Perioperative Events       Post-op nausea and vomiting: no PONV    Postoperative Status  Cardiovascular status: hemodynamically stable  Respiratory status: spontaneous ventilation  Additional comments:   Post-Anesthesia Evaluation Attestation: The indicated post-anethesia care was provided.    Staff name:  05/17/21, MD            Perioperative Events  There were no known complications for this encounter.

## 2021-05-15 NOTE — Progress Notes
Care Plan   Care Category & Patient Outcome Goal Met Treatment/  Interventions Plan of the Day RN Name   Cardiovascular  Hemodynamic stability and adequate peripheral perfusion.   Yes   Refer to  patient's chart. Monitor VS per sedation standard. Monitor ECG continuously.  Assess/maintain IV patency.   Pat Kocher, RN   Respiratory  Patent airway, ease of respiration, and adequate oxygenation.   Yes   Refer to  patient's chart. Maintain open airway. Assess respirations. Monitor O2 saturations. Titrate O2 to keep sat>= 95% or baseline.   Pat Kocher, RN   Psychological/Emotional/Spiritual  Cope with procedure with support in place.  Spiritual needs are addressed.     Yes   Refer to  patient's chart. Provide adequate and thorough instructions.  Provide a caring and supportive environment.  Communicate patients concerns with other members of the health team.   Pat Kocher, RN   Pain  Patients pain goal met.   Yes   Refer to  patient's chart. Prepare patient for potentially uncomfortable procedure.  Observe for verbal/nonverbal complaints of pain.  Assess pain.  Provide comfort measures.   Pat Kocher, RN   Safety/Fall Risk  Free from injury, security maintained.   Yes High Risk    Refer to  patient's chart.   Follow nursing standard of practice for high risks fall patients.   Pat Kocher, RN   Knowledge Base  Verbalize understanding of procedure/information provided.   Yes   Refer to  patient's chart. Describe the procedure along with what symptoms to expect.  Evaluate patients understanding of procedure.  Encourage patient to ask questions.  Provide additional information as needed.   Pat Kocher, RN

## 2021-05-15 NOTE — Progress Notes
Potassium check was 6.8. Rechecking to make sure sample is not hemolyzed so will get new sample and send to lab.

## 2021-05-15 NOTE — Assessment & Plan Note
Patient is on anticoagulation chronically for her history of pulmonary embolus.

## 2021-05-15 NOTE — Assessment & Plan Note
Unfortunately, she remains in atrial fibrillation today with a rapid ventricular response.  When I first met her 6 weeks ago, she was in A. fib and acute heart failure.  Unfortunately, cardioversion could not be performed due to the presence of a left atrial appendage clot.    I am concerned that Sandra Parks is at high risk for redeveloping heart failure given her uncontrolled atrial fibrillation.  She is due to undergo TEE guided cardioversion today.  If there remains a persistent thrombus, then I would recommend we proceed with AV node ablation and biventricular defibrillator implantation.  I have discussed this with the patient at length.  We discussed the risks and benefits of the procedure including the fact that she will become essentially pacemaker dependent after this.  Sandra Parks is agreeable to proceeding as she has had multiple family members with atrial fibrillation who have died subsequently.    If she does convert successfully back to sinus rhythm, I would initiate amiodarone 400 mg twice daily.

## 2021-05-15 NOTE — Progress Notes
Pre-Operative Assessment for TEE or Cardioversion    Date of Service:  05/15/2021    Sandra Parks is a 75 y.o. y.o. female. With significant ..., who is referred for TEE and Cardioversion Indication: PAF/Flutter, LAA, thrombus .      she has been compliant with her  rivaroxaban (Xarelto) Missed dose: Yesand ... bleeding. she is Positive for: GERD and Varices - EGD  Unknown and Positive for: COPD / Asthma and O2 .      GI procedures:EGD            When: When/Date: unsure, about 2 years ago per patient    Chest pain:  No   SOB: No         Medical History:  Medical History:   Diagnosis Date   ? Atrial flutter (HCC)     s/p ablation in 2004   ? B12 deficiency 01/13/2012   ? Chronic anticoagulation 01/14/2012   ? Diabetes mellitus (HCC) 07/12/2009   ? Hyperlipidemia 07/12/2009   ? Hypertension 07/12/2009   ? Iron deficiency anemia 01/13/2012   ? PAF (paroxysmal atrial fibrillation) (HCC) 04/12/2009   ? Paroxysmal atrial fibrillation (HCC)    ? Supraventricular tachycardia (HCC) 07/12/2009   ? Type II diabetes mellitus (HCC) 07/12/2009        Surgical History:   Surgical History:   Procedure Laterality Date   ? HX SHOULDER SURGERY  01/03/12       Social History     Social History     Tobacco Use   ? Smoking status: Never Smoker   ? Smokeless tobacco: Never Used   Vaping Use   ? Vaping Use: Never used   Substance Use Topics   ? Alcohol use: Yes     Comment: very rarely   ? Drug use: No         Allergies                                        Allergies   Allergen Reactions   ? Ceftriaxone HIVES     Has tolerated amoxicillin per pt report   ? Ciprocinonide HIVES   ? Sulfa (Sulfonamide Antibiotics) HIVES          Current Medications  Current Outpatient Medications on File Prior to Encounter   Medication Sig Dispense Refill   ? acetaminophen (TYLENOL EXTRA STRENGTH) 500 mg tablet Take 1,000-1,500 mg by mouth every 4 hours as needed for Pain. Max of 4,000 mg of acetaminophen in 24 hours.     ? albuterol 0.5% (PROVENTIL) 2.5 mg/0.5 mL nebulizer solution Inhale 2.5 mg solution by nebulizer as directed every 6 hours as needed for Shortness of Breath or Wheezing.     ? alendronate (FOSAMAX) 70 mg tablet Take 70 mg by mouth every 7 days.     ? aspirin EC 81 mg tablet Take one tablet by mouth daily. Take with food. 90 tablet 1   ? bumetanide (BUMEX) 1 mg tablet Take one tablet by mouth twice daily. 90 tablet 1   ? calcium carbonate/vitamin D-3 (OSCAL-500+D) 1250 mg/200 unit tablet Take 1 tablet by mouth twice daily. Calcium Carb 1250mg  delivers 500mg  elemental Ca     ? dapagliflozin (FARXIGA) 10 mg tablet Take one tablet by mouth daily. 90 tablet 1   ? insulin glargine (LANTUS SOLOSTAR U-100 INSULIN) 100 unit/mL (3 mL) subcutaneous PEN Inject  twenty Units under the skin at bedtime daily. 45 mL    ? losartan (COZAAR) 25 mg tablet Take one tablet by mouth daily. 90 tablet 1   ? magnesium oxide (MAGOX) 400 mg (241.3 mg magnesium) tablet Take one tablet by mouth twice daily. 180 tablet 0   ? metoprolol XL (TOPROL XL) 50 mg extended release tablet Take 1.5 tablets by mouth twice daily. 270 tablet 3   ? pantoprazole DR (PROTONIX) 40 mg tablet Take 40 mg by mouth daily.     ? potassium chloride SR (K-DUR) 20 mEq tablet Take 1 tablet by mouth daily. Take with a meal and a full glass of water.     ? rivaroxaban (XARELTO) 20 mg tablet Take one tablet by mouth daily with dinner. Take with food. 90 tablet 1   ? rosuvastatin (CRESTOR) 20 mg tablet Take 1 tablet by mouth once daily (Patient taking differently: Take 20 mg by mouth at bedtime daily.) 90 tablet 0   ? sertraline (ZOLOFT) 100 mg tablet Take 100 mg by mouth at bedtime daily.     ? spironolactone (ALDACTONE) 25 mg tablet Take one tablet by mouth daily. Take with food. 90 tablet 0   ? vitamins, multiple tablet Take 1 Tab by mouth Daily.     ? Wheat Dextrin (BENEFIBER CLEAR) 3 gram/3.5 gram pwpk Dissolve 2-3 tsp in drink of choice and drink daily as needed       No current facility-administered medications on file prior to encounter.       Vitals  Estimated body mass index is 40.76 kg/m? as calculated from the following:    Height as of this encounter: 175.3 cm (5' 9).    Weight as of this encounter: 125.2 kg (276 lb).       Patient appears alert and oriented: Yes  NPO: for greater than 8 hours except for clear liquids  Inpatient IV status: None    Diagnostic Tests  White Blood Cells   Date Value Ref Range Status   04/07/2021 4.0 (L) 4.5 - 11.0 K/UL Final     Hemoglobin   Date Value Ref Range Status   04/07/2021 10.2 (L) 12.0 - 15.0 GM/DL Final     Hematocrit   Date Value Ref Range Status   04/07/2021 31.6 (L) 36 - 45 % Final     Platelet Count   Date Value Ref Range Status   04/07/2021 201 150 - 400 K/UL Final     Sodium   Date Value Ref Range Status   04/07/2021 140 137 - 147 MMOL/L Final     Potassium   Date Value Ref Range Status   04/07/2021 3.3 (L) 3.5 - 5.1 MMOL/L Final     Magnesium   Date Value Ref Range Status   04/07/2021 1.6 1.6 - 2.6 mg/dL Final     Blood Urea Nitrogen   Date Value Ref Range Status   04/07/2021 16 7 - 25 MG/DL Final     Creatinine   Date Value Ref Range Status   04/07/2021 1.45 (H) 0.4 - 1.00 MG/DL Final     Creatinine, POC   Date Value Ref Range Status   04/14/2021 1.5 (H) 0.4 - 1.00 MG/DL Final     Glucose   Date Value Ref Range Status   04/07/2021 87 70 - 100 MG/DL Final   16/08/9603 540 (H) 70 - 110 MG/DL Final       Last MAC INR Flow Sheet Entry:  Last recorded Lab results:   INR Home   Date Value Ref Range Status   08/19/2019 2.1  Final   06/16/2018 2.8  Final   02/21/2018 2.4  Final   01/29/2018 1.8  Final   11/28/2017 2.9  Final   11/15/2017 4.0  Final     INR   Date Value Ref Range Status   04/04/2021 2.8 (H) 0.8 - 1.2 Final   01/13/2021 3.5 (H)  Final   12/09/2020 2.3 (H) 0.89 - 1.11 Final   12/03/2020 4.9  Final   10/12/2020 2.3  Final   09/23/2020 2.9  Final     APTT   Date Value Ref Range Status   04/04/2021 32.9 24.0 - 36.5 SEC Final           Blood Cultures  Resulted Micro Last 72 Hrs    No results found         Last TEE date: 04/06/21  Last Cardioversion date: None  Echo procedures within the past 30 days:  No results found.      Device Information on File  No results found for: GENERATOR, EPDEVTYP      Additional Comments:  None    Plan:  Dr. Maisie Fus or Dr. Raynelle Bring will plan to proceed with the  TEE and Cardioversion.

## 2021-05-16 ENCOUNTER — Encounter: Admit: 2021-05-16 | Discharge: 2021-05-16 | Payer: MEDICARE

## 2021-05-25 ENCOUNTER — Encounter: Admit: 2021-05-25 | Discharge: 2021-05-25 | Payer: MEDICARE

## 2021-05-25 NOTE — Progress Notes
Patient has upcoming appointment with APRN Fara Boros. Open order for labs not completed. Message sent to nursing team

## 2021-05-25 NOTE — Progress Notes
Patient: Sandra Parks, DOB: 11-Apr-1946    Requesting most recent blood work  to be faxed to [facility]  Please fax to  Attention:APRN Margo Aye at the [facility]    Fax number [phone]  Thank you.

## 2021-05-29 ENCOUNTER — Encounter: Admit: 2021-05-29 | Discharge: 2021-05-29 | Payer: MEDICARE

## 2021-05-29 DIAGNOSIS — I5041 Acute combined systolic (congestive) and diastolic (congestive) heart failure: Secondary | ICD-10-CM

## 2021-05-29 DIAGNOSIS — I5042 Chronic combined systolic (congestive) and diastolic (congestive) heart failure: Secondary | ICD-10-CM

## 2021-05-29 DIAGNOSIS — Z86711 Personal history of pulmonary embolism: Secondary | ICD-10-CM

## 2021-05-29 DIAGNOSIS — I4819 Other persistent atrial fibrillation: Secondary | ICD-10-CM

## 2021-05-29 DIAGNOSIS — I10 Essential (primary) hypertension: Secondary | ICD-10-CM

## 2021-06-01 ENCOUNTER — Encounter: Admit: 2021-06-01 | Discharge: 2021-06-01 | Payer: MEDICARE

## 2021-06-01 NOTE — Telephone Encounter
Called and LMOM of pt with follow up from lab results and recommendation for iron infusions as documented below from [place], NP.    Requested call back to review if pt would like to see if infusions could be ordered at an infusion center closer to her home in [place], [place] or at [facility].    Routed to [place], HF NCM to review if available outside of [place].

## 2021-06-01 NOTE — Telephone Encounter
-----   Message from Sue Lush, APRN-NP sent at 05/31/2021  5:01 PM CDT -----  Per the FAIR HF study, HF patients benefit from Iron infusion if Ferritin < 100 or if Ferritin 100-299 with Tsat < 20% even if the Hgb 9.5-13.5      ----- Message -----  From: Nida Boatman, RN  Sent: 05/31/2021  11:12 AM CDT  To: Sue Lush, APRN-NP, #    Hi [name]    Could you tell me what indication there is for the iron infusion as her iron panel looks to be within normal range?  I just want to make sure I can educate the patient correctly.    Thank you  [name]  ----- Message -----  From: Sue Lush, APRN-NP  Sent: 05/30/2021  12:49 PM CDT  To: Leafy Ro, RN, Cvm Nurse Hf Team [place]    She meets criteria for iron infusion, would rec injectofer two doses. Ok to arrange a hospital closer to home if [place] is far away.   ----- Message -----  From: Leafy Ro, RN  Sent: 05/29/2021  11:21 AM CDT  To: Sue Lush, APRN-NP    Labs drawn after OV on 5/27. Recommendations: convert lasix to bumex 1 mg BID and start losartan 25 mg daily,  BMP, CBC, Iron panel and TSH in 1 week. Had another OV on 6/15: BMP in 3 days (never drawn).    Pt to see you on 7/20. Please review.

## 2021-06-01 NOTE — Telephone Encounter
Called and spoke to pt; she denies any symptoms of hyperglycemia. She did not take her morning insulin, but she also did not eat prior to labs drawn at [facility] at 12:14 pm today.  She will contact her PCP now, [facility], [facility], who manages her diabetes. I will also call them and fax BMP lab results when received in fax.    Update sent to Fara Boros, NP for other recommendations.     Called [facility], spoke with Revonda Standard, nurse for [place], [place]. Updated her on the pt's report; she will f/u with PA and contact pt with recommendations. We will fax results to office when received.

## 2021-06-01 NOTE — Telephone Encounter
Pt returned call for MB regarding scheduling iron infusion.

## 2021-06-01 NOTE — Telephone Encounter
Called and spoke with pt; she would prefer iron infusions to be scheduled for [facility], [place] if able. Routed to HF NCM, Alger Memos to assist.

## 2021-06-01 NOTE — Telephone Encounter
Sandra Parks, HF NCM confirmed she will f/u with iron infusions:    Kristeen Miss, RN  Guy Franco  Caller: Unspecified (Today, 10:49 AM)  Yes, I will help get this set up.     Thanks   Sandra Parks

## 2021-06-01 NOTE — Progress Notes
See progress note for f/u with pt's PCP, [facility], [place], PA for f/u of critical glucose

## 2021-06-02 ENCOUNTER — Encounter: Admit: 2021-06-02 | Discharge: 2021-06-02 | Payer: MEDICARE

## 2021-06-02 DIAGNOSIS — D509 Iron deficiency anemia, unspecified: Secondary | ICD-10-CM

## 2021-06-02 MED ORDER — FERRIC CARBOXYMALTOSE IVPB
750 mg | Freq: Once | INTRAVENOUS | 0 refills | Status: DC
Start: 2021-06-02 — End: 2021-06-02

## 2021-06-02 MED ORDER — FERRIC CARBOXYMALTOSE IVPB
750 mg | Freq: Once | INTRAVENOUS | 0 refills | Status: AC
Start: 2021-06-02 — End: ?

## 2021-06-07 ENCOUNTER — Encounter: Admit: 2021-06-07 | Discharge: 2021-06-07 | Payer: MEDICARE

## 2021-06-07 ENCOUNTER — Ambulatory Visit: Admit: 2021-06-07 | Discharge: 2021-06-08 | Payer: MEDICARE

## 2021-06-07 DIAGNOSIS — I4892 Unspecified atrial flutter: Secondary | ICD-10-CM

## 2021-06-07 DIAGNOSIS — Z7901 Long term (current) use of anticoagulants: Secondary | ICD-10-CM

## 2021-06-07 DIAGNOSIS — I10 Essential (primary) hypertension: Secondary | ICD-10-CM

## 2021-06-07 DIAGNOSIS — E119 Type 2 diabetes mellitus without complications: Secondary | ICD-10-CM

## 2021-06-07 DIAGNOSIS — E538 Deficiency of other specified B group vitamins: Secondary | ICD-10-CM

## 2021-06-07 DIAGNOSIS — D509 Iron deficiency anemia, unspecified: Secondary | ICD-10-CM

## 2021-06-07 DIAGNOSIS — I471 Supraventricular tachycardia: Secondary | ICD-10-CM

## 2021-06-07 DIAGNOSIS — I509 Heart failure, unspecified: Secondary | ICD-10-CM

## 2021-06-07 DIAGNOSIS — I4819 Other persistent atrial fibrillation: Principal | ICD-10-CM

## 2021-06-07 DIAGNOSIS — E785 Hyperlipidemia, unspecified: Secondary | ICD-10-CM

## 2021-06-07 DIAGNOSIS — I480 Paroxysmal atrial fibrillation: Secondary | ICD-10-CM

## 2021-06-07 MED ORDER — ENTRESTO 24-26 MG PO TAB
1 | ORAL_TABLET | Freq: Two times a day (BID) | ORAL | 3 refills | Status: AC
Start: 2021-06-07 — End: ?

## 2021-06-07 NOTE — Progress Notes
Date of Service: 06/07/2021    Sandra Parks is a 75 y.o. female.       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 ?  ?  She was admitted to?[facility] on 04/04/2021 after she saw Dr. Arna Medici in clinic in [place]. ?She is in A. fib with RVR and acute on chronic heart failure. ?She had not been taking loop diuretic Lasix.??Echo with newly reduced LVEF of 35% down from 55-60% at baseline. She was diuresed with IV lasix?and d/c on lasix 20 mg daily. Unable to complete DCCV due to evidence of LV thrombus. Converted from eliquis to Xarelto with plans for TEE/DCCV with Dr Bradly Bienenstock on 05/15/21. Stress test as outpatient after cardioversion and repeat assessment of LVEF when out of afib.?    She underwent DCCV by Dr Bradly Bienenstock on 05/15/21. Dr Arna Medici ordered MPI which was mildly abnormal (see below).     She was last seen by me on 05/03/21 at which time she remained in afib and was quite symptomatic.     Pt presents today reporting that she has felt better incrementally since her cardioversion. She went home with HR 90s and it has gradually come down to 60s. She denies dizziness, palpitations, CP (other than mild chest heaviness at rest) and SOB only when walking outside. She has a hill to climb to get to her car that she has to stop twice to climb, but prior to cardioversion she couldn't make it at all. She has had some constipation which is responding to stool softeners and miralax. Her lower legs are looking red and mildly tender. No apparent bleeding (stool, urine, nose). BP at home running 120s/x.     Patient denies palpitations, dizziness, syncope, falls, LE swelling, abd bloating, orthopnea or PND.          Vitals:    06/07/21 1416   BP: (!) 144/80   BP Source: Arm, Right Upper   Pulse: 68   SpO2: 96%   PainSc: Zero   Weight: 118.5 kg (261 lb 3.2 oz)   Height: 175.3 cm (5' 9)     Body mass index is 38.57 kg/m?Marland Kitchen   Wt Readings from Last 3 Encounters:   06/07/21 118.5 kg (261 lb 3.2 oz)   05/15/21 125.2 kg (276 lb)   05/15/21 125.6 kg (276 lb 12.8 oz)        Past Medical History  Patient Active Problem List    Diagnosis Date Noted   ? Thrombus of left atrial appendage 05/09/2021     04/06/2021 - TEE:  There is dense spontaneous echo contrast and a small sludge/forming thrombus at the apex of the left atrial appendage. LAA emptying velocity=20 cm/s.  Left atrium is mildly dilated.  Right atrium is severely dilated.  No color Doppler evidence of interatrial shunting.  Normal LV size with moderately reduced LV systolic function.  Visually estimated LVEF = 35%.  Right ventricle is moderately dilated with mildly reduced contractility.  Estimated PASP = 29 mmHg + RA pressure.  Mild mitral annular calcification with mild thickening/calcification of leaflets and chordae.  No stenosis.  Moderate MR.  Dilated tricuspid annulus with severe tricuspid regurgitation associated with reversed hepatic venous systolic flow.  Sclerotic and calcified aortic valve without significant stenosis or regurgitation.  No significant pericardial effusion.  Left pleural effusion was  incidentally noted.         ? Chronic combined systolic and diastolic heart failure (HCC) 04/06/2021   ? Atrial fibrillation (HCC) 04/04/2021   ? Congestive heart failure, unspecified (HCC) 03/09/2021   ? 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 History of pulmonary embolism 05/28/2012   ? Syncope 01/14/2012   ? History of atrial flutter 01/14/2012     2004: RFA     ? AKI (acute kidney injury) (HCC) 01/14/2012   ? Chronic anticoagulation 01/14/2012   ? Iron deficiency anemia 01/13/2012   ? B12 deficiency 01/13/2012   ? Bilateral pulmonary embolism (HCC) 01/08/2012   ? Hypertension 07/12/2009   ? Supraventricular tachycardia (HCC) 07/12/2009   ? Hyperlipidemia 07/12/2009   ? Type II diabetes mellitus (HCC) 07/12/2009   ? Persistent atrial fibrillation (HCC) 04/12/2009         ROS * see HPI      Physical Exam  ? Alert and oriented with appropriate communication skills  ? JVD 6 cm at 90 degrees with (-) HJR  ? No abdominal distention  ? Non pitting LE edema/chronic lymphedema with pinkish color (not tender or feverish compared to other parts of her leg);  palms of hands with visible microvasculature/bluish-green-purple which is unusual for her  ? Breathing comfortably without wheezing, rales or rhonchi  ? Auscultated heart with normal S1S2, normal rate and rhythm, no obvious murmur or extra heart sounds  ? Able to walk into clinic unassisted      Cardiovascular Studies    04/06/21 Echocardiogram  ? Normal left ventricular size and wall thickness with moderately reduced systolic function. LVEF=35%.  ? There is apical swirling of contrast without clear evidence of LV thrombus.  ? Regional wall motion abnormalities as depicted in the diagram below.  ? The right ventricle is moderately dilated with mild to moderately reduced contractility.  ? Severely dilated right atrium. ?Mildly dilated right atrium.  ? Normal central venous pressure (0-5 mmHg).  ? Estimated pulmonary artery systolic pressure is 34?mmHg.  ? Mitral annular calcification and leaflet thickening without stenosis. ?Moderate MR  ? Dilated tricuspid annulus with moderate to severe regurgitation.  ? Sclerotic and calcified aortic valve without significant stenosis or regurgitation.  ? Trivial pericardial effusion adjacent to the LV free wall. ?Left pleural effusion was incidentally noted    TEE 05/15/21:  ? No definite acute left atrial appendage thrombus.  ? Left Ventricle: The left ventricular size is normal. The left ventricular systolic function is moderately reduced.  ? Right Ventricle: Dilated right ventricle The right ventricular systolic function is at the lower limit of normal.  ? Mitral Valve: Normal valve structure. Mild to moderate regurgitation.  ? Tricuspid Valve: Moderate to severe regurgitation.  ? Trivial pericardial effusion.    EKG 06/07/21: NSR with prolonged QT interval of 532 ms      Problems Addressed Today  Encounter Diagnoses   Name Primary?   ? Persistent atrial fibrillation (HCC) Yes   ? 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  - convert  losartan to entresto  - MPI mildly abnormal, consider PET/Stress? Will discuss with Dr Arna Medici  - plan to repeat echo in ~ 6 weeks to assess LVEF on GDMT out of afib    GDMT Current Dose Changes made at visit   BB Toprol XL 75 mg BID     ACEI/ARB/ARNI Losartan 25 mg daily Entresto 24/26 mg BID   Aldosterone antagonist Spironolactone 25 mg daily     SGLT2i Farxiga 10 mg daily      Hydralazine/Nitrate NA     Ivabradine NA    HRMT  No ICD - titrating GDMT    Diuretics Bumex 1 mg BID     Cardiac rehab       Atrial Fibrillation,?Persistent:  SVT:   S/p?CTI flutter?ablation?2004, prev on sotalol and brief course of amiodarone  S/p TEE/DCCV 05/15/21  A/c with?rivaroxaban?20 mg daily  Treated with?Toprol XL? and amiodarone 400 mg BID x 1 week then 400 mg daily  Followed by Dr Bradly Bienenstock   > EKG in clinic today NSR 65    Moderate to Severe TR  RV moderately dilated with reduced RV systolic function  Est PASP ~ 34 mmHg    Hypertension  BP today?144/80  Currently on??GDMT as per above  ?  Chronic hypoxia  H/o PE  She wears nocturnal oxygen 2 L nightly for at least 2 years per patient.  -Per patient sleep apnea testing has been negative  A/c with Xarelto 20 mg daily  - no apparent bleeding, reports compliance with dosing  ?  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  H/o Fe Def Anemia  H/H 10.2/31.6 upon discharge, iron panel not checked  Last iron panel 04/25/21 with Ferritin 41 and Tsat 21%  > rec iron infusion (in process)   ?  DM Type II   > cont on Farxiga (new start) and lantus  - glucose 406 on labs from 06/01/21, pt report held AM insulin, PCP notified  Lab Results   Component Value Date/Time    HGBA1C 8.2 (H) 04/05/2021 04:13 AM    HGBA1C 8.0 (H) 12/07/2019 12:00 AM    HGBA1C 6.9 05/27/2012 12:00 AM        Dyslipidemia  > cont on rosuvastatin 20 mg QHS  Lab Results   Component Value Date/Time    CHOL 66 04/06/2021 04:34 AM    TRIG 79 04/06/2021 04:34 AM    HDL 18 (L) 04/06/2021 04:34 AM    LDL 43 04/06/2021 04:34 AM    VLDL 16 04/06/2021 04:34 AM    NONHDLCHOL 48 04/06/2021 04:34 AM    CHOLHDLC 4 12/07/2019 12:00 AM               Current Medications (including today's revisions)  ? acetaminophen (TYLENOL EXTRA STRENGTH) 500 mg tablet Take 1,000-1,500 mg by mouth every 4 hours as needed for Pain. Max of 4,000 mg of acetaminophen in 24 hours.   ? albuterol 0.5% (PROVENTIL) 2.5 mg/0.5 mL nebulizer solution Inhale 2.5 mg solution by nebulizer as directed every 6 hours as needed for Shortness of Breath or Wheezing.   ? alendronate (FOSAMAX) 70 mg tablet Take 70 mg by mouth every 7 days.   ? amiodarone (PACERONE) 400 mg tablet Take one tablet by mouth twice daily. Take 400mg  twice a day for 1 week, then decrease to 400mg  daily.   ? aspirin EC 81 mg tablet Take one tablet by mouth daily. Take with food.   ?  bumetanide (BUMEX) 1 mg tablet Take one tablet by mouth twice daily.   ? calcium carbonate/vitamin D-3 (OSCAL-500+D) 1250 mg/200 unit tablet Take 1 tablet by mouth twice daily. Calcium Carb 1250mg  delivers 500mg  elemental Ca   ? dapagliflozin (FARXIGA) 10 mg tablet Take one tablet by mouth daily.   ? insulin glargine (LANTUS SOLOSTAR U-100 INSULIN) 100 unit/mL (3 mL) subcutaneous PEN Inject twenty Units under the skin at bedtime daily.   ? magnesium oxide (MAGOX) 400 mg (241.3 mg magnesium) tablet Take one tablet by mouth twice daily.   ? pantoprazole DR (PROTONIX) 40 mg tablet Take 40 mg by mouth daily.   ? potassium chloride SR (K-DUR) 20 mEq tablet Take 1 tablet by mouth daily. Take with a meal and a full glass of water.   ? rivaroxaban (XARELTO) 20 mg tablet Take one tablet by mouth daily with dinner. Take with food.   ? rosuvastatin (CRESTOR) 20 mg tablet Take 1 tablet by mouth once daily (Patient taking differently: Take 20 mg by mouth at bedtime daily.)   ? sertraline (ZOLOFT) 100 mg tablet Take 100 mg by mouth at bedtime daily.   ? spironolactone (ALDACTONE) 25 mg tablet Take one tablet by mouth daily. Take with food.   ? vitamins, multiple tablet Take 1 Tab by mouth Daily.   ? Wheat Dextrin (BENEFIBER CLEAR) 3 gram/3.5 gram pwpk Dissolve 2-3 tsp in drink of choice and drink daily as needed            Total Time Today was 45 minutes in the following activities: Preparing to see the patient, Obtaining and/or reviewing separately obtained history, Performing a medically appropriate examination and/or evaluation, Counseling and educating the patient/family/caregiver, Ordering medications, tests, or procedures, Referring and communication with other health care professionals (when not separately reported) and Documenting clinical information in the electronic or other health record     Sue Lush, APRN, NP-C, CHFN  Advanced Heart Failure APP  [facility]  Phone [phone]  Fax [phone]  [email or web address]  [address], [address]  [place], [place] [address]    Collaborating physician Kathleen Argue, MD

## 2021-06-08 ENCOUNTER — Encounter: Admit: 2021-06-08 | Discharge: 2021-06-08 | Payer: MEDICARE

## 2021-06-08 DIAGNOSIS — I471 Supraventricular tachycardia: Secondary | ICD-10-CM

## 2021-06-08 DIAGNOSIS — T462X4A Poisoning by other antidysrhythmic drugs, undetermined, initial encounter: Secondary | ICD-10-CM

## 2021-06-08 MED ORDER — AMIODARONE 200 MG PO TAB
200 mg | ORAL_TABLET | Freq: Two times a day (BID) | ORAL | 3 refills | 42.00000 days | Status: AC
Start: 2021-06-08 — End: ?

## 2021-06-08 MED ORDER — ENTRESTO 24-26 MG PO TAB
1 | ORAL_TABLET | Freq: Two times a day (BID) | ORAL | 3 refills | Status: AC
Start: 2021-06-08 — End: ?

## 2021-06-08 NOTE — Telephone Encounter
Call placed to [facility] pharmacy and confirmed Sherryll Burger does not require a prior authorization. Pt's copay is $166.21. MC msg sent to pt suggesting she apply for MAP. Routing to [place] for further follow up.

## 2021-06-08 NOTE — Telephone Encounter
Called and spoke with patient, she is agreeable to reducing dose of amiodarone to 200 mg daily, she will have EKG done in [place] on 7/26 . No further questions at this time

## 2021-06-08 NOTE — Telephone Encounter
-----   Message from Floy Sabina, RN sent at 06/08/2021 11:36 AM CDT -----  She could do Tuesday in [place] between 1:30-3 or Wednesday in [place]. [place] from 1:30-3. Tuesday would be the best if she could do that.     Let me know so we can get it added to the schedule.    Thanks.  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  We have a pt that lives your direction and needs to have an EKG done next week.  Can this be scheduled at your facility?  We need to adjust her amio dose    Thank you    Jacquiline Zurcher   ----- Message -----  From: Sue Lush, APRN-NP  Sent: 06/08/2021   7:45 AM CDT  To: [facility] Nurse [facility]    Please call patient and have her decrease amiodarone to 200 mg once daily, discussed with Dr Bradly Bienenstock. Ask her to repeat EKG next week. Can this be done in [place]?   ----- Message -----  From: Kathreen Cornfield, MD  Sent: 06/07/2021   4:50 PM CDT  To: Sue Lush, APRN-NP    Agree that QT is long.  Go ahead and drop her to 200 a day.  Recheck EKG next week?    I don't know about the hands business.  I suspect it is amiodarone although who knows...    ----- Message -----  From: Sue Lush, APRN-NP  Sent: 06/07/2021   3:20 PM CDT  To: Kathreen Cornfield, MD    I saw Sandra Parks today and she is feeling so much better out of afib. EKG today NSR although her QT interval was > 500 ms. She also showed me the palms of her hands which show her microvasculature coloring green/purple/blue which is new for her (unusual) and she has redness in her LE below her knees. I was wondering if this could be related to amiodarone? She is still on 400 mg daily. I do not think it is cellulitis in her legs as not feverish or tender to touch. 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     Please advise.     I am starting her on entresto, plan to repeat echo to eval her EF in about 6 weeks. Thanks for taking care of her afib!

## 2021-06-08 NOTE — Telephone Encounter
----- Message from Nida Boatman, RN sent at 06/08/2021 12:10 PM CDT -----  Please send to [name] APRN she will covering [name][employer] in basket while she is on vacation next week    Thank you  ----- Message -----  From: Floy Sabina, RN  Sent: 06/08/2021  12:07 PM CDT  To: Nida Boatman, RN    Sounds good! I'll add her on.  Who should the EKG go to?    ----- Message -----  From: Nida Boatman, RN  Sent: 06/08/2021  12:05 PM CDT  To: Floy Sabina, RN    She will do 2pm on Tuesday 7/26, she says she knows where the clinic is since she sees Dr Arna Medici there    Thank you for your help    [name]   ----- Message -----  From: Floy Sabina, RN  Sent: 06/08/2021  11:59 AM CDT  To: Nida Boatman, RN    [facility] [facility] is at [facility] in [place]  [address]  [place], [place] [address]    Pt enters in through the physicians building/office side on the right.   [phone]    Just let me know what time, 1:30, 2, 2:30, or 3 and I can schedule it for you. Which doctor do they need to send the EKG to that day to review?        ----- Message -----  From: Nida Boatman, RN  Sent: 06/08/2021  11:48 AM CDT  To: Floy Sabina, RN    Hi [name] I will contact here.    What is the [place] address and phone number?  What is the best way to schedule her? Should I just message you back after I talk to her?    Thanks so much    [name]   ----- Message -----  From: Floy Sabina, RN  Sent: 06/08/2021  11:37 AM CDT  To: Nida Boatman, RN    She could do Tuesday in [place] between 1:30-3 or Wednesday in [place]. [place] from 1:30-3. Tuesday would be the best if she could do that.     Let me know so we can get it added to the schedule.    Thanks.  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  We have a pt that lives your direction and needs to have an EKG done next week.  Can this be scheduled at your facility?  We need to adjust her amio dose    Thank you    [name]   ----- Message -----  From: Sue Lush, APRN-NP  Sent: 06/08/2021   7:45 AM CDT  To: [facility] Nurse [facility] Team [facility]    Please call patient and have her decrease amiodarone to 200 mg once daily, discussed with Dr Bradly Bienenstock. Ask her to repeat EKG next week. Can this be done in [place]?   ----- Message -----  From: Kathreen Cornfield, MD  Sent: 06/07/2021   4:50 PM CDT  To: Sue Lush, APRN-NP    Agree that QT is long.  Go ahead and drop her to 200 a day.  Recheck EKG next week?    I don't know about the hands business.  I suspect it is amiodarone although who knows...    ----- Message -----  From: Sue Lush,  APRN-NP  Sent: 06/07/2021   3:20 PM CDT  To: Kathreen Cornfield, MD    I saw Sandra Parks today and she is feeling so much better out of afib. EKG today NSR although her QT interval was > 500 ms. She also showed me the palms of her hands which show her microvasculature coloring green/purple/blue which is new for her (unusual) and she has redness in her LE below her knees. I was wondering if this could be related to amiodarone? She is still on 400 mg daily. I do not think it is cellulitis in her legs as not feverish or tender to touch. 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     Please advise.     I am starting her on entresto, plan to repeat echo to eval her EF in about 6 weeks. Thanks for taking care of her afib!

## 2021-06-12 ENCOUNTER — Encounter: Admit: 2021-06-12 | Discharge: 2021-06-12 | Payer: MEDICARE

## 2021-06-13 ENCOUNTER — Encounter: Admit: 2021-06-13 | Discharge: 2021-06-13 | Payer: MEDICARE

## 2021-06-13 DIAGNOSIS — I509 Heart failure, unspecified: Secondary | ICD-10-CM

## 2021-06-13 DIAGNOSIS — I4819 Other persistent atrial fibrillation: Secondary | ICD-10-CM

## 2021-06-13 NOTE — Progress Notes
Patient here for ekg only.  NSR states feeling much better.

## 2021-06-14 ENCOUNTER — Encounter: Admit: 2021-06-14 | Discharge: 2021-06-14 | Payer: MEDICARE

## 2021-06-14 NOTE — Progress Notes
Medication Assistance Program packet has been sent to patient for signatures and other required documents (if applicable) . Once received back, medication assistance coordinator will begin processing     Shunta Mclaurin  Medication Assistance Coordinator   [phone]

## 2021-06-16 ENCOUNTER — Encounter: Admit: 2021-06-16 | Discharge: 2021-06-16 | Payer: MEDICARE

## 2021-06-16 DIAGNOSIS — I4819 Other persistent atrial fibrillation: Secondary | ICD-10-CM

## 2021-06-16 DIAGNOSIS — I509 Heart failure, unspecified: Secondary | ICD-10-CM

## 2021-06-22 ENCOUNTER — Encounter: Admit: 2021-06-22 | Discharge: 2021-06-22 | Payer: MEDICARE

## 2021-06-22 NOTE — Progress Notes
Medication Assistance Program packet has been sent to patient for signatures and other required documents (if applicable) . Once received back, medication assistance coordinator will begin processing     Amreen Raczkowski  Medication Assistance Coordinator   [phone]

## 2021-07-04 ENCOUNTER — Encounter: Admit: 2021-07-04 | Discharge: 2021-07-04 | Payer: MEDICARE

## 2021-07-04 MED ORDER — ROSUVASTATIN 20 MG PO TAB
ORAL_TABLET | Freq: Every day | ORAL | 3 refills | 90.00000 days | Status: AC
Start: 2021-07-04 — End: ?

## 2021-07-10 ENCOUNTER — Encounter: Admit: 2021-07-10 | Discharge: 2021-07-10 | Payer: MEDICARE

## 2021-07-10 ENCOUNTER — Ambulatory Visit: Admit: 2021-07-10 | Discharge: 2021-07-10 | Payer: MEDICARE

## 2021-07-10 DIAGNOSIS — Z86711 Personal history of pulmonary embolism: Secondary | ICD-10-CM

## 2021-07-10 DIAGNOSIS — I10 Essential (primary) hypertension: Secondary | ICD-10-CM

## 2021-07-10 DIAGNOSIS — I4819 Other persistent atrial fibrillation: Secondary | ICD-10-CM

## 2021-07-10 DIAGNOSIS — E538 Deficiency of other specified B group vitamins: Secondary | ICD-10-CM

## 2021-07-10 DIAGNOSIS — I480 Paroxysmal atrial fibrillation: Secondary | ICD-10-CM

## 2021-07-10 DIAGNOSIS — E785 Hyperlipidemia, unspecified: Secondary | ICD-10-CM

## 2021-07-10 DIAGNOSIS — Z7901 Long term (current) use of anticoagulants: Secondary | ICD-10-CM

## 2021-07-10 DIAGNOSIS — E119 Type 2 diabetes mellitus without complications: Secondary | ICD-10-CM

## 2021-07-10 DIAGNOSIS — I513 Intracardiac thrombosis, not elsewhere classified: Secondary | ICD-10-CM

## 2021-07-10 DIAGNOSIS — I471 Supraventricular tachycardia: Secondary | ICD-10-CM

## 2021-07-10 DIAGNOSIS — I4892 Unspecified atrial flutter: Secondary | ICD-10-CM

## 2021-07-10 DIAGNOSIS — I5042 Chronic combined systolic (congestive) and diastolic (congestive) heart failure: Secondary | ICD-10-CM

## 2021-07-10 DIAGNOSIS — D509 Iron deficiency anemia, unspecified: Secondary | ICD-10-CM

## 2021-07-10 MED ORDER — ENTRESTO 49-51 MG PO TAB
1 | ORAL_TABLET | Freq: Two times a day (BID) | ORAL | 1 refills | Status: AC
Start: 2021-07-10 — End: ?

## 2021-07-10 NOTE — Patient Instructions
Med changes today:  Increase entresto to 49/51 mg twice a day (may take two of your 24/26 mg tablets twice a day, new Rx will be for higher dose) We will plan to increase to high dose entresto if your labs and BP are okay in a week, so that you can be on goal dose for your echo before you see Dr Arna Medici on 08/15/21  Procedures planned: Echo at [facility] prior to 08/15/21  Please log your weight, blood pressure, and heart rate and bring to all visits. Bring your BP cuff in to check accuracy.   Labs in 1 week: BMP  Continue with 2 liter/day or 64 ounces/day fluid restriction and 2 gm (2000 mg) sodium restriction/day  Return to see Dr. Arna Medici  in 6 weeks as scheduled in [place]     Please call the office with any questions or concerns [phone] (nurse triage).  To schedule or change an appointment call [phone].      Sue Lush, [facility], APRN-NP  Nurse Practitioner    Advanced Heart Failure, [facility] Team  Kathleen Argue, MD   Bing Matter, MD  Nurse Practitioners: Satira Anis, Fara Boros, Huma Baig and [name]  RNs: Nida Boatman, Mick Sell, Dawn Mosetta [facility] Phone: [phone] Fax: [phone]    Your Heart Failure Symptom Awareness and Action Plan  Every Day Action Plan  Weigh yourself in the morning before breakfast. Write it down and compare it to yesterday's weight.  Take your medicine, as prescribed. Please call if you have concerns about the side effects, cost or refills.  Check for worsened swelling in your feet, ankles and stomach  Follow a 2 gm (2000 mg) salt diet.  Keep all healthcare appointments    Green Zone   Good! Symptoms are under control No shortness of breath  No increase in ankle swelling  No weight gain  No chest pain  No change in your usual activity  Continue to follow your everyday action plan.    Yellow Zone  If you have any of these symptoms, please call the heart failure nurses: [phone] Increased shortness of breath with activity  Weight gain of 3 pounds in one day or 5 pounds in a week  Increased swelling in your ankles or legs  Increased swelling in your stomach  Increasing fatigue  You may need an adjustment of your medications.    Red Zone  These are urgent symptoms. Please call the heart failure nurses: [phone] Shortness of breath at rest or waking up at night feeling short of breath or coughing  Increased number of pillows used or needing to sit upright to sleep  Chest tightness at rest  Dizziness, lightheadedness or feeling faint You need to schedule an appointment   Emergency Zone - call 911 Worsening chest tightness or pain that is not relieved by medication  Severe shortness of breath and a cough with pink, frothy sputum

## 2021-07-10 NOTE — Progress Notes
Date of Service: 07/10/2021    Sandra Parks is a 75 y.o. female.       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 ?  ?  She was admitted to?[facility] on 04/04/2021 after she saw Dr. Arna Medici in clinic in [place]. ?She was in A. fib with RVR and acute on chronic heart failure. ?She had not been taking furosemide.??Echo with newly reduced LVEF of 35% down from 55-60% at baseline. She was diuresed with IV lasix?and d/c on lasix 20 mg daily. Unable to complete DCCV due to evidence of LV thrombus. Converted from eliquis to Xarelto with plans for TEE/DCCV with Dr Bradly Bienenstock on 05/15/21.?  ?  She underwent DCCV by Dr Bradly Bienenstock on 05/15/21. Dr Arna Medici ordered MPI which was mildly abnormal (see below). She remains in NSR although had a prolonged QT interval and Dr Bradly Bienenstock rec that we stop the loading dose of amiodarone early and cont on at 200 mg daily. Repeat EKG with improved QTc but remains > 500 ms.?    She was last seen by me on 06/07/21 at which time she was started on entresto low dose in place of losartan 25 mg daily.     Pt presents today reporting she is feeling so much better out of afib. She remains SOB with any incline, rushing or climbing stairs, but not at all walking on a flat surface or at her own pace. She has occasional balance issues, at home due to walking barefoot, but denies dizziness. States it is easy to lose my balance. she has occasional epistaxis however, mild. Occasional blood in her stool, associated with hemorrhoids and constipation.     Patient denies CP, palpitations, dizziness, syncope, falls, LE swelling, abd bloating, orthopnea or PND.          Vitals:    07/10/21 1554   BP: (!) 168/78   BP Source: Arm, Left Lower   Pulse: 62   SpO2: 95%   PainSc: Zero   Weight: 118.9 kg (262 lb 3.2 oz)   Height: 175.3 cm (5' 9)     Body mass index is 38.72 kg/m?Marland Kitchen   Wt Readings from Last 3 Encounters:   07/10/21 118.9 kg (262 lb 3.2 oz)   06/07/21 118.5 kg (261 lb 3.2 oz)   05/15/21 125.2 kg (276 lb)        Past Medical History  Patient Active Problem List    Diagnosis Date Noted   ? Thrombus of left atrial appendage 05/09/2021     04/06/2021 - TEE:  There is dense spontaneous echo contrast and a small sludge/forming thrombus at the apex of the left atrial appendage. LAA emptying velocity=20 cm/s.  Left atrium is mildly dilated.  Right atrium is severely dilated.  No color Doppler evidence of interatrial shunting.  Normal LV size with moderately reduced LV systolic function.  Visually estimated LVEF = 35%.  Right ventricle is moderately dilated with mildly reduced contractility.  Estimated PASP = 29 mmHg + RA pressure.  Mild mitral annular calcification with mild thickening/calcification of leaflets and chordae.  No stenosis.  Moderate MR.  Dilated tricuspid annulus with severe tricuspid regurgitation associated with reversed hepatic venous systolic flow.  Sclerotic and calcified aortic valve without significant stenosis or regurgitation.  No significant pericardial effusion.  Left pleural effusion was incidentally noted.         ?  Chronic combined systolic and diastolic heart failure (HCC) 04/06/2021   ? Atrial fibrillation (HCC) 04/04/2021   ? Congestive heart failure, unspecified (HCC) 03/09/2021   ? 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 History of pulmonary embolism 05/28/2012   ? Syncope 01/14/2012   ? History of atrial flutter 01/14/2012     2004: RFA     ? AKI (acute kidney injury) (HCC) 01/14/2012   ? Chronic anticoagulation 01/14/2012   ? Iron deficiency anemia 01/13/2012   ? B12 deficiency 01/13/2012   ? Bilateral pulmonary embolism (HCC) 01/08/2012   ? Hypertension 07/12/2009   ? Supraventricular tachycardia (HCC) 07/12/2009   ? Hyperlipidemia 07/12/2009   ? Type II diabetes mellitus (HCC) 07/12/2009   ? Persistent atrial fibrillation (HCC) 04/12/2009         ROS * see HPI      Physical Exam  Alert and oriented with appropriate communication skills  JVD 6 cm at 90 degrees with (-) HJR  No abdominal distention  No LE edema  Breathing comfortably without wheezing, rales or rhonchi  Auscultated heart with normal S1S2, normal rate and rhythm, no obvious murmur or extra heart sounds  Able to walk into clinic unassisted      Cardiovascular Studies  Echo 04/06/21  ? Baseline rhythm appears to be atrial fibrillation throughout the study.     ? Normal left ventricular size and wall thickness with moderately reduced systolic function. LVEF=35%.  ? There is apical swirling of contrast without clear evidence of LV thrombus.  ? Regional wall motion abnormalities as depicted in the diagram below.  ? The right ventricle is moderately dilated with mild to moderately reduced contractility.  ? Severely dilated right atrium.  Mildly dilated right atrium.  ? Normal central venous pressure (0-5 mmHg).  ? Estimated pulmonary artery systolic pressure is 34 mmHg.  ? Mitral annular calcification and leaflet thickening without stenosis.  Moderate MR  ? Dilated tricuspid annulus with moderate to severe regurgitation.  ? Sclerotic and calcified aortic valve without significant stenosis or regurgitation.  ? Trivial pericardial effusion adjacent to the LV free wall.  Left pleural effusion was incidentally noted        Problems Addressed Today  Encounter Diagnoses   Name Primary?   ? Chronic combined systolic and diastolic heart failure (HCC) Yes   ? Primary hypertension    ? Persistent atrial fibrillation (HCC)    ? Thrombus of left atrial appendage    ? 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 History of pulmonary embolism    ? Chronic anticoagulation        Assessment and Plan     HFrEF related to?NICM?  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  - increase entresto to mid dose with plans to increase to high dose after a week if tolerating mid dose  - repeat BMP in a week  - repeat echo in [place] prior to f/u with Dr Arna Medici in [place] on 9/27    GDMT Current Dose Changes made at visit   BB Toprol XL 75 mg BID     ACEI/ARB/ARNI Entresto 24/26 mg BID Entresto 49/51 mg BID   Aldosterone antagonist Spironolactone  25 mg daily     SGLT2i Farxiga 10 mg daily      Hydralazine/Nitrate NA     Ivabradine NA    HRMT  No ICD - titrating GDMT    Diuretics Bumex 1 mg BID     Cardiac rehab       Atrial Fibrillation,?Persistent:  SVT:   S/p?CTI flutter?ablation?2004, prev on sotalol and brief course of amiodarone  S/p TEE/DCCV 05/15/21  A/c with?rivaroxaban?20 mg daily  Treated with?Toprol XL? and amiodarone 200 mg daily, last QTc >  Followed by Dr Bradly Bienenstock     Moderate to Severe TR  RV moderately dilated with reduced RV systolic function  Est PASP ~ 34 mmHg  > repeat echo prior to next f/u with Dr Arna Medici  ?  Hypertension  BP today?150/84 (repeat by NP)   Currently on??GDMT as per above  ?  Chronic hypoxia  H/o PE  She wears nocturnal oxygen 2 L nightly for at least 2 years per patient.  -Per patient sleep apnea testing has been negative  A/c with Xarelto 20 mg daily  - mild epistaxis and hemorrhoids/constipation  ?  Chronic Kidney Disease - Stage?III:  -Baseline creatinine?1.1-1.4  - Last creat?1.22?from 06/15/21?  ?  H/o Fe Def Anemia  H/H 10.2/31.6 upon discharge, iron panel not checked  Last iron panel 04/25/21 with Ferritin 41 and Tsat 21%  > rec iron infusion (in process)   ?  DM Type II   > cont on Farxiga (new start) and lantus  Lab Results   Component Value Date/Time    HGBA1C 8.2 (H) 04/05/2021 04:13 AM    HGBA1C 8.0 (H) 12/07/2019 12:00 AM    HGBA1C 6.9 05/27/2012 12:00 AM        Dyslipidemia  > cont on rosuvastatin 20 mg QHs  Lab Results   Component Value Date/Time    CHOL 66 04/06/2021 04:34 AM    TRIG 79 04/06/2021 04:34 AM    HDL 18 (L) 04/06/2021 04:34 AM    LDL 43 04/06/2021 04:34 AM    VLDL 16 04/06/2021 04:34 AM    NONHDLCHOL 48 04/06/2021 04:34 AM    CHOLHDLC 4 12/07/2019 12:00 AM               Current Medications (including today's revisions)  ? acetaminophen (TYLENOL EXTRA STRENGTH) 500 mg tablet Take 1,000-1,500 mg by mouth every 4 hours as needed for Pain. Max of 4,000 mg of acetaminophen in 24 hours.   ? albuterol 0.5% (PROVENTIL) 2.5 mg/0.5 mL nebulizer solution Inhale 2.5 mg solution by nebulizer as directed every 6 hours as needed for Shortness of Breath or Wheezing.   ? alendronate (FOSAMAX) 70 mg tablet Take 70 mg by mouth every 7 days.   ? amiodarone (PACERONE) 200 mg tablet Take one tablet by mouth twice daily. Take 400mg  twice a day for 1 week, then decrease to 400mg  daily.   ? aspirin EC 81 mg tablet Take one tablet by mouth daily. Take with food.   ? bumetanide (BUMEX) 1 mg tablet Take one tablet by mouth twice daily.   ? calcium carbonate/vitamin D-3 (OSCAL-500+D) 1250 mg/200 unit tablet Take 1 tablet by mouth twice daily. Calcium Carb 1250mg  delivers 500mg  elemental Ca   ? dapagliflozin (FARXIGA) 10 mg tablet Take one tablet by mouth daily.   ? insulin glargine (LANTUS SOLOSTAR U-100 INSULIN) 100 unit/mL (3 mL) subcutaneous PEN Inject twenty Units under the skin at bedtime daily.   ? magnesium oxide (MAGOX)  400 mg (241.3 mg magnesium) tablet Take one tablet by mouth twice daily.   ? pantoprazole DR (PROTONIX) 40 mg tablet Take 40 mg by mouth daily.   ? potassium chloride SR (K-DUR) 20 mEq tablet Take 1 tablet by mouth daily. Take with a meal and a full glass of water.   ? rivaroxaban (XARELTO) 20 mg tablet Take one tablet by mouth daily with dinner. Take with food.   ? rosuvastatin (CRESTOR) 20 mg tablet Take 1 tablet by mouth once daily   ? sacubitriL-valsartan (ENTRESTO) 49-51 mg tablet Take one tablet by mouth twice daily.   ? sertraline (ZOLOFT) 100 mg tablet Take 100 mg by mouth at bedtime daily.   ? spironolactone (ALDACTONE) 25 mg tablet Take one tablet by mouth daily. Take with food.   ? vitamins, multiple tablet Take 1 Tab by mouth Daily.   ? Wheat Dextrin (BENEFIBER CLEAR) 3 gram/3.5 gram pwpk Dissolve 2-3 tsp in drink of choice and drink daily as needed            Total Time Today was 45 minutes in the following activities: Preparing to see the patient, Obtaining and/or reviewing separately obtained history, Performing a medically appropriate examination and/or evaluation, Counseling and educating the patient/family/caregiver, Ordering medications, tests, or procedures, Referring and communication with other health care professionals (when not separately reported) and Documenting clinical information in the electronic or other health record     Sue Lush, APRN, NP-C, CHFN  Advanced Heart Failure APP  [facility] [place] [facility]  Phone [phone]  Fax [phone]  [email or web address]  [address], [address]  [place], [place] [address]    Collaborating physician Kathleen Argue, MD

## 2021-07-19 ENCOUNTER — Encounter: Admit: 2021-07-19 | Discharge: 2021-07-19 | Payer: MEDICARE

## 2021-07-19 DIAGNOSIS — I5042 Chronic combined systolic (congestive) and diastolic (congestive) heart failure: Secondary | ICD-10-CM

## 2021-07-19 LAB — BASIC METABOLIC PANEL
ANION GAP: 9 (ref 0–14)
BLD UREA NITROGEN: 15 (ref 9.8–20.1)
CALCIUM: 9.2 (ref 8.4–10.2)
CHLORIDE: 104 (ref 98–107)
CO2: 30 (ref 23–31)
CREATININE: 1.1 — ABNORMAL HIGH (ref 0.57–1.11)
GFR ESTIMATED: 49 (ref >59)
GLUCOSE,PANEL: 186 — ABNORMAL HIGH (ref 70–105)
POTASSIUM: 3.9 (ref 3.5–5.1)
SODIUM: 139 (ref 136–145)

## 2021-07-19 NOTE — Telephone Encounter
LVM for patient to please call back with BP update and any concerns since starting mid dose Entresto.  Route information to [facility] for review.    If stable BP and no symptoms will increase to high dose per Albin Felling note below

## 2021-07-19 NOTE — Telephone Encounter
-----   Message from Sue Lush, APRN-NP sent at 07/19/2021  2:13 PM CDT -----  Please inquire about any symptoms or significant BP changes. If she is tolerating well, would go ahead and increase to high dose please. Thank you.   ----- Message -----  From: Leafy Ro, RN  Sent: 07/19/2021  10:51 AM CDT  To: Sue Lush, APRN-NP    Last OV 8/22. Recommendations: increase entresto 49-51 BID, BMP 1 week.    Results to review. Any new recommendations?

## 2021-07-20 ENCOUNTER — Encounter: Admit: 2021-07-20 | Discharge: 2021-07-20 | Payer: MEDICARE

## 2021-08-11 ENCOUNTER — Encounter: Admit: 2021-08-11 | Discharge: 2021-08-11 | Payer: MEDICARE

## 2021-08-11 ENCOUNTER — Ambulatory Visit: Admit: 2021-08-11 | Discharge: 2021-08-11 | Payer: MEDICARE

## 2021-08-11 DIAGNOSIS — I5042 Chronic combined systolic (congestive) and diastolic (congestive) heart failure: Secondary | ICD-10-CM

## 2021-08-11 MED ORDER — PERFLUTREN LIPID MICROSPHERES 1.1 MG/ML IV SUSP
1-10 mL | Freq: Once | INTRAVENOUS | 0 refills | Status: CP | PRN
Start: 2021-08-11 — End: ?

## 2021-08-15 ENCOUNTER — Encounter: Admit: 2021-08-15 | Discharge: 2021-08-15 | Payer: MEDICARE

## 2021-08-15 DIAGNOSIS — I10 Essential (primary) hypertension: Secondary | ICD-10-CM

## 2021-08-15 DIAGNOSIS — I4892 Unspecified atrial flutter: Secondary | ICD-10-CM

## 2021-08-15 DIAGNOSIS — E119 Type 2 diabetes mellitus without complications: Secondary | ICD-10-CM

## 2021-08-15 DIAGNOSIS — Z7901 Long term (current) use of anticoagulants: Secondary | ICD-10-CM

## 2021-08-15 DIAGNOSIS — E538 Deficiency of other specified B group vitamins: Secondary | ICD-10-CM

## 2021-08-15 DIAGNOSIS — I480 Paroxysmal atrial fibrillation: Secondary | ICD-10-CM

## 2021-08-15 DIAGNOSIS — I4819 Other persistent atrial fibrillation: Secondary | ICD-10-CM

## 2021-08-15 DIAGNOSIS — D509 Iron deficiency anemia, unspecified: Secondary | ICD-10-CM

## 2021-08-15 DIAGNOSIS — R0989 Other specified symptoms and signs involving the circulatory and respiratory systems: Secondary | ICD-10-CM

## 2021-08-15 DIAGNOSIS — I471 Supraventricular tachycardia: Secondary | ICD-10-CM

## 2021-08-15 DIAGNOSIS — E785 Hyperlipidemia, unspecified: Secondary | ICD-10-CM

## 2021-08-15 NOTE — Patient Instructions
Call [facility] to schedule pulmonary function test  [phone]    Follow up as directed.  Call sooner if issues.  Call the [place] nursing line at [phone].  Leave a detailed message for the nurse in [place] with how we can assist you and we will call you back.

## 2021-08-15 NOTE — Progress Notes
Date of Service: 08/15/2021    Sandra Parks is a 75 y.o. female.       HPI      Sandra Parks is followed for paroxysmal atrial flutter/fibrillation, hypertension, and congestive heart failure. ?She is also followed for hyperlipidemia and diabetes mellitus.?  When I saw her on March 09, 2021 she was in atrial fibrillation and on 03/10/2021 she switched from warfarin to apixaban in preparation for cardioversion.    Cardioversion could not be performed in May 2022 because transesophageal echo showed evidence of left ventricular thrombus.  Following continued anticoagulation, she did ultimately undergo cardioversion on 05/15/2021.  She was then placed on amiodarone to suppress recurrences of atrial fibrillation.  Since cardioversion in June 2022 she has felt considerably better.  Her congestive heart failure remains well compensated.?Otherwise, The patient reports no angina, and she is not aware of palpitations.  She reports no recent falls, presyncope or syncope.?The patient reports no myalgias, bleeding abnormalities, or strokelike symptoms.  Overall she feels much improved.  She can walk a block at a time and reports no nocturnal dyspnea or orthopnea.  Her greatest symptom is that she has difficulty sleeping at night and feels tired throughout the day  ?  Historically, in February 2004, she underwent radiofrequency ablation for atrial flutter, although this appeared to be only 1 of the supraventricular arrhythmias that she was having. She has been on Coumadin because her CHA2DS2-VASc score for stroke and systemic embolization risk is now 4, namely for gender, age, hypertension and diabetes mellitus. In December 2012 warfarin was stopped 4-5 days prior to surgery to repair her left rotator cuff. The patient reports that surgery was uneventful. Warfarin was resumed after surgery but stopped, once again, 4-5 days prior to surgery to repair her right rotator cuff on 01/03/12. On 01/06/12 she became short of breath and on 01/07/12 she was admitted to [facility] with a diagnosis of pulmonary embolism. Sandra Parks was treated with unfractionated heparin and then warfarin was resumed. 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 She was hospitalized from 12/09/13 thru 12/11/13 for sinusitis but also reports treatment for bronchitis and pneumonia. She required treatment with steroids which made her blood pressure and blood sugar difficult to control. Sandra Parks reports that she was hospitalized again for approximately 4 days in February 2015 for a sinus infection requiring antibiotic therapy. The patient reports that she had her sinuses irrigated as an outpatient in March 2015 and has had no recurrent sinus infections. The patient fell on 01/19/15 and suffered a comminuted fracture of the distal left radius. She reports that she did not require surgery and was place in a protective sling. She reports one other fall.??Her falls were associated with?lightheaded which occurred?with standing for 15 minutes.?Due to her orthostatic symptoms, her hydralazine was discontinued and her carvedilol was reduced to 12.5 mg twice daily from 25 mg twice daily. She felt much better after this dose reduction.?The patient reports having a persistent sinus infection over the wintertime 2017/2018?associated with ocular migraines. She stopped her warfarin in preparation for sinus surgery and underwent sinus surgery the week of July 08, 2017. 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 ?She was hospitalized until 07/22/2017. ?Her warfarin was restarted. ?On 08/01/2017 she had a syncopal episode shortly after urinating and then standing. She felt lightheaded prior to losing consciousness and she reports that she fractured her left hand.??This was repaired surgically in late September  2018. ?This time she received anticoagulation bridging when her warfarin was stopped in preparation for orthopedic surgery and she reports no complications from surgery.?Sandra Parks?was seen in the emergency department at [facility] hospital?on 10/21/2018 following an overdose of medication.?Her son died unexpectedly?in 11-18-19.  ?I notice?that she was seen in the emergency room locally on January 14, 2021 for altered mental status with disorientation and confusion. ?She was noted to be hypoglycemic and to have pneumonia and was admitted. ?She was also seen in the emergency room on December 05, 2020 for hypotension and?was discharged home. ?She was hospitalized from November 19, 2020 until November 24, 2020 for atrial fibrillation, COPD and for an?exacerbation and bronchitis.?         Vitals:    08/15/21 1250   BP: 132/80   BP Source: Arm, Right Lower   Pulse: 61   SpO2: 96%   O2 Device: None (Room air)   PainSc: Zero   Weight: 121.6 kg (268 lb)   Height: 175.3 cm (5' 9)     Body mass index is 39.58 kg/m?Marland Kitchen     Past Medical History  Patient Active Problem List    Diagnosis Date Noted   ? Thrombus of left atrial appendage 05/09/2021     04/06/2021 - TEE:  There is dense spontaneous echo contrast and a small sludge/forming thrombus at the apex of the left atrial appendage. LAA emptying velocity=20 cm/s.  Left atrium is mildly dilated.  Right atrium is severely dilated.  No color Doppler evidence of interatrial shunting.  Normal LV size with moderately reduced LV systolic function.  Visually estimated LVEF = 35%.  Right ventricle is moderately dilated with mildly reduced contractility.  Estimated PASP = 29 mmHg + RA pressure.  Mild mitral annular calcification with mild thickening/calcification of leaflets and chordae.  No stenosis.  Moderate MR.  Dilated tricuspid annulus with severe tricuspid regurgitation associated with reversed hepatic venous systolic flow.  Sclerotic and calcified aortic valve without significant stenosis or regurgitation.  No significant pericardial effusion.  Left pleural effusion was incidentally noted.         ? Chronic combined systolic and diastolic heart failure (HCC) 04/06/2021   ? Atrial fibrillation (HCC) 04/04/2021   ? Congestive heart failure, unspecified (HCC) 03/09/2021   ? 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 History of pulmonary embolism 05/28/2012   ? Syncope 01/14/2012   ? History of atrial flutter 01/14/2012     2004: RFA     ? AKI (acute kidney injury) (HCC) 01/14/2012   ? Chronic anticoagulation 01/14/2012   ? Iron deficiency anemia 01/13/2012   ? B12 deficiency 01/13/2012   ? Bilateral pulmonary embolism (HCC) 01/08/2012   ? Hypertension 07/12/2009   ? Supraventricular tachycardia (HCC) 07/12/2009   ? Hyperlipidemia 07/12/2009   ? Type II diabetes mellitus (HCC) 07/12/2009   ? Persistent atrial fibrillation (HCC) 04/12/2009         Review of Systems   Constitutional: Negative.   HENT: Negative.    Eyes: Negative.    Cardiovascular: Negative.    Respiratory: Negative.    Endocrine: Negative.    Hematologic/Lymphatic: Negative.    Skin: Negative.    Musculoskeletal: Negative.    Gastrointestinal: Negative.    Genitourinary: Negative.    Neurological: Negative.    Psychiatric/Behavioral: Negative.    Allergic/Immunologic: Negative.      Physical Exam  GENERAL: The patient is well developed, well nourished,  resting comfortably and in no distress. ?  HEENT: No abnormalities of the visible oro-nasopharynx, conjunctiva or sclera are noted.  NECK: + jugular venous distension. Carotids are palpable and without bruits. There is no thyroid enlargement.  Chest: Lung fields are clear to auscultation. There are no wheezes or crackles.  CV: There is?a a regular cardiac rhythm with normal first and second heart sound. ?There are no murmurs, gallops or rubs. Her apical heart rate is?60?BPM.  ABD: The abdomen is soft and supple with normal bowel sounds. There is no hepatosplenomegaly, ascites, tenderness, masses or bruits.  Neuro: There are no focal motor defects. Ambulation is normal. Cognitive function appears normal.  Ext:?There is?3+?bipedal edema without evidence of deep vein thrombosis. She does have 3+ bilateral lower leg edema. ?Peripheral pulses are satisfactory.   SKIN:?There are no rashes and no cellulitis  PSYCH:?The patient is calm, rationale and oriented    Cardiovascular Studies  A twelve-lead ECG obtained on 08/15/2021 reveals normal sinus rhythm with a heart rate of 59 bpm.  Her QT interval is 526 ms with a QT corrected of 520 ms.    Echo-Doppler 08/11/2021:  Interpretation Summary    1. Normal LV size and systolic function. LVEF 55-65%  2. Mildly dilated RV with preserved systolic function.   3. Moderate left atrial dilatation.   4. Mild mitral regurgitation.   5. Estimated peak systolic PA pressure of 41 mmHg  ?  Compared to May 2022, LV function is improved. Mitral and tricuspid regurgitation improved.   ?    Echocardiographic Findings    Left Ventricle The left ventricular size is normal. The left ventricular wall thickness is normal. Normal geometry. The left ventricular systolic function is normal. Cannot determine left ventricular diastolic function.   Right Ventricle The right ventricle is mildly dilated. The right ventricular wall thickness is normal. The right ventricular systolic function is normal.   Left Atrium Moderately dilated.   Right Atrium Normal size.   IVC/SVC Normal central venous pressure (0-5 mm Hg).   Mitral Valve No stenosis. Mild regurgitation. There is mild mitral annular calcification.   Tricuspid Valve Normal valve structure. No stenosis. Trace regurgitation.   Aortic Valve The valve is sclerotic. No stenosis. No regurgitation.   Pulmonary The pulmonic valve was not seen well but no Doppler evidence of stenosis.   Aorta The aortic root and ascending aorta are normal in size.   Pericardium No pericardial effusion.         Cardiovascular Health Factors  Vitals BP Readings from Last 3 Encounters: 08/15/21 132/80   08/11/21 (!) 177/78   07/10/21 (!) 168/78     Wt Readings from Last 3 Encounters:   08/15/21 121.6 kg (268 lb)   08/11/21 121.6 kg (268 lb)   07/10/21 118.9 kg (262 lb 3.2 oz)     BMI Readings from Last 3 Encounters:   08/15/21 39.58 kg/m?   08/11/21 39.58 kg/m?   07/10/21 38.72 kg/m?      Smoking Social History     Tobacco Use   Smoking Status Never Smoker   Smokeless Tobacco Never Used      Lipid Profile Cholesterol   Date Value Ref Range Status   04/06/2021 66 <200 MG/DL Final     HDL   Date Value Ref Range Status   04/06/2021 18 (L) >40 MG/DL Final     LDL   Date Value Ref Range Status   04/06/2021 43 <100 mg/dL Final     Triglycerides  Date Value Ref Range Status   04/06/2021 79 <150 MG/DL Final      Blood Sugar Hemoglobin A1C   Date Value Ref Range Status   04/05/2021 8.2 (H) 4.0 - 6.0 % Final     Comment:     The ADA recommends that most patients with type 1 and type 2 diabetes maintain   an A1c level <7%.       Glucose   Date Value Ref Range Status   07/18/2021 186 (H) 70 - 105 Final   06/15/2021 196 (H) 70 - 105 Final   06/01/2021 406 (H) 65 - 99 Final   03/26/2004 120 (H) 70 - 110 MG/DL Final   16/08/9603 540 (H) 70 - 110 MG/DL Final     Glucose, POC   Date Value Ref Range Status   05/15/2021 156 (H) 70 - 100 MG/DL Final   98/09/9146 829 (H) 70 - 100 MG/DL Final   56/21/3086 578 (H) 70 - 100 MG/DL Final   46/96/2952 66 (L) 70 - 100 MG/DL Final          Problems Addressed Today  Encounter Diagnoses   Name Primary?   ? Cardiovascular symptoms Yes   ? Persistent atrial fibrillation (HCC)        Assessment and Plan   Mrs. Kinstler feels quite a bit better in sinus rhythm.  A clinician patient risk discussion was held today about the continuation of amiodarone.  The risk and benefits of amiodarone were reviewed with the patient and she wanted to continue the medication for suppression of atrial fibrillation.  I have asked her that our protocol to monitor for toxicity associated with amiodarone be utilized: I discussed potential multi-organ toxicities from amiodarone with the patient including but not limited to: pulmonary (pulmonary fibrosis), hepatic, thyroid, skin and eye.  ?  Baseline Amiodarone Testing:   Inpatient: TSH and liver function tests (Alkaline phosphatase, AST/ALT, and total bilirubin)   Outpatient: Eye exam and PFTs (Pulmonary function tests)  ?  Outpatient Amiodarone Monitoring: The following will need to be arranged...  Every 6 months:   TSH  Liver function tests (Alkaline phosphatase, AST/ALT, and total bilirubin)   Annually:   Eye exam  Chest X-Ray  PFTs (Pulmonary function tests)    Her chest to the score appears to be 5 for age, heart failure, diabetes, hypertension and gender. The risks and benefits of anticoagulation therapy have been reviewed with the patient. The patient understands that anticoagulation is used to decrease thrombotic or clotting complications associated with atrial fibrillation/flutter, such as stroke and systemic embolization which can be disabling or fatal, but can, on occasion, lead to life-threatening bleeding complications including gastrointestinal and intracranial hemorrhage. The patient wishes to continue anticoagulation with rivaroxaban.   Ms. Lora has very well compensated congestive heart failure.  She no longer wants to take spironolactone or Marcelline Deist but is willing to continue with Entresto.  Her recent echo now shows normal left ventricular systolic function.  I have asked her to return for follow-up in 3 months time to follow her progress. The total time spent during this interview and exam was 30 minutes.         Current Medications (including today's revisions)  ? acetaminophen (TYLENOL EXTRA STRENGTH) 500 mg tablet Take 1,000-1,500 mg by mouth every 4 hours as needed for Pain. Max of 4,000 mg of acetaminophen in 24 hours.   ? albuterol 0.5% (PROVENTIL) 2.5 mg/0.5 mL nebulizer solution Inhale 2.5 mg solution by nebulizer  as directed every 6 hours as needed for Shortness of Breath or Wheezing.   ? alendronate (FOSAMAX) 70 mg tablet Take 70 mg by mouth every 7 days.   ? amiodarone (PACERONE) 200 mg tablet Take one tablet by mouth twice daily. Take 400mg  twice a day for 1 week, then decrease to 400mg  daily. (Patient taking differently: Take 200 mg by mouth daily.)   ? aspirin EC 81 mg tablet Take one tablet by mouth daily. Take with food.   ? bumetanide (BUMEX) 1 mg tablet Take one tablet by mouth twice daily. (Patient taking differently: Take 1 mg by mouth daily.)   ? calcium carbonate/vitamin D-3 (OSCAL-500+D) 1250 mg/200 unit tablet Take 1 tablet by mouth twice daily. Calcium Carb 1250mg  delivers 500mg  elemental Ca   ? insulin glargine (LANTUS SOLOSTAR U-100 INSULIN) 100 unit/mL (3 mL) subcutaneous PEN Inject twenty Units under the skin at bedtime daily.   ? magnesium oxide (MAGOX) 400 mg (241.3 mg magnesium) tablet Take one tablet by mouth twice daily.   ? potassium chloride SR (K-DUR) 20 mEq tablet Take 1 tablet by mouth daily. Take with a meal and a full glass of water.   ? rivaroxaban (XARELTO) 20 mg tablet Take one tablet by mouth daily with dinner. Take with food.   ? rosuvastatin (CRESTOR) 20 mg tablet Take 1 tablet by mouth once daily   ? sacubitriL-valsartan (ENTRESTO) 97-103 mg tablet Take one tablet by mouth twice daily.   ? sertraline (ZOLOFT) 100 mg tablet Take 100 mg by mouth at bedtime daily.   ? vitamins, multiple tablet Take 1 Tab by mouth Daily.   ? Wheat Dextrin (BENEFIBER CLEAR) 3 gram/3.5 gram pwpk Dissolve 2-3 tsp in drink of choice and drink daily as needed

## 2021-08-21 ENCOUNTER — Encounter: Admit: 2021-08-21 | Discharge: 2021-08-21 | Payer: MEDICARE

## 2021-08-21 DIAGNOSIS — Z5181 Encounter for therapeutic drug level monitoring: Secondary | ICD-10-CM

## 2021-08-21 NOTE — Progress Notes
Patient called nursing line to report she called [facility] scheduling to set up PFTs but was told she does not have an order. [facility] ordered PFTs for amiodarone monitoring on 09/28. Order placed and sent to [facility] scheduling.

## 2021-08-23 ENCOUNTER — Encounter: Admit: 2021-08-23 | Discharge: 2021-08-23 | Payer: MEDICARE

## 2021-08-23 NOTE — Progress Notes
Amiodarone Monitoring status as of 08/23/21:     Amiodarone monitoring complete.  Next amiodarone review is due in 180 days.    Most recent lab results  Lab Results   Component Value Date/Time    AST 14 05/15/2021 04:00 PM    ALT 8 05/15/2021 04:00 PM    TSH 2.16 05/15/2021 04:00 PM    FREET4R 1.27 04/25/2021 12:00 AM         Procedures  Last chest X-Ray: 01/24/21  Last PFT: 08/23/21  Last eye exam:

## 2021-08-25 ENCOUNTER — Encounter: Admit: 2021-08-25 | Discharge: 2021-08-25 | Payer: MEDICARE

## 2021-08-25 MED ORDER — POTASSIUM CHLORIDE 20 MEQ PO TBTQ
20 meq | ORAL_TABLET | Freq: Every day | ORAL | 3 refills | 30.00000 days | Status: AC
Start: 2021-08-25 — End: ?

## 2021-09-12 ENCOUNTER — Encounter: Admit: 2021-09-12 | Discharge: 2021-09-12 | Payer: MEDICARE

## 2021-09-12 ENCOUNTER — Ambulatory Visit: Admit: 2021-09-12 | Discharge: 2021-09-12 | Payer: MEDICARE

## 2021-09-12 DIAGNOSIS — I4892 Unspecified atrial flutter: Secondary | ICD-10-CM

## 2021-09-12 DIAGNOSIS — I513 Intracardiac thrombosis, not elsewhere classified: Secondary | ICD-10-CM

## 2021-09-12 DIAGNOSIS — I480 Paroxysmal atrial fibrillation: Secondary | ICD-10-CM

## 2021-09-12 DIAGNOSIS — I4819 Other persistent atrial fibrillation: Secondary | ICD-10-CM

## 2021-09-12 DIAGNOSIS — E119 Type 2 diabetes mellitus without complications: Secondary | ICD-10-CM

## 2021-09-12 DIAGNOSIS — R000 Tachycardia, unspecified: Secondary | ICD-10-CM

## 2021-09-12 DIAGNOSIS — I10 Essential (primary) hypertension: Secondary | ICD-10-CM

## 2021-09-12 DIAGNOSIS — I2699 Other pulmonary embolism without acute cor pulmonale: Secondary | ICD-10-CM

## 2021-09-12 DIAGNOSIS — E785 Hyperlipidemia, unspecified: Secondary | ICD-10-CM

## 2021-09-12 DIAGNOSIS — E1169 Type 2 diabetes mellitus with other specified complication: Secondary | ICD-10-CM

## 2021-09-12 DIAGNOSIS — Z86711 Personal history of pulmonary embolism: Secondary | ICD-10-CM

## 2021-09-12 DIAGNOSIS — E782 Mixed hyperlipidemia: Secondary | ICD-10-CM

## 2021-09-12 DIAGNOSIS — D509 Iron deficiency anemia, unspecified: Secondary | ICD-10-CM

## 2021-09-12 DIAGNOSIS — I5042 Chronic combined systolic (congestive) and diastolic (congestive) heart failure: Secondary | ICD-10-CM

## 2021-09-12 DIAGNOSIS — Z7901 Long term (current) use of anticoagulants: Secondary | ICD-10-CM

## 2021-09-12 DIAGNOSIS — I471 Supraventricular tachycardia: Secondary | ICD-10-CM

## 2021-09-12 DIAGNOSIS — R0989 Other specified symptoms and signs involving the circulatory and respiratory systems: Secondary | ICD-10-CM

## 2021-09-12 DIAGNOSIS — E538 Deficiency of other specified B group vitamins: Secondary | ICD-10-CM

## 2021-09-12 NOTE — Progress Notes
Date of Service: 09/12/2021    Sandra Parks is a 75 y.o. female.       HPI  Sandra Parks was seen in the office today in electrophysiology follow up. As you may know, she is a 75 y.o. female, with past medical history including diabetes mellitus type 2, morbid obesity, hypertension and paroxysmal atrial arrhythmias.  She did undergo a flutter ablation at outside hospital in 2004.  She was continued on anticoagulation with some evidence of atrial fibrillation at that time.  She apparently did develop bilateral pulmonary embolism in 2018 while off of anticoagulation.  She was hospitalized at outside hospital early this year with acute systolic congestive heart failure in the setting of atrial fibrillation.  She did follow-up with her primary cardiologist, Dr. Debby Bud, and arrange for outpatient TEE guided cardioversion.  Unfortunately she did have evidence of left atrial appendage thrombus and cardioversion was postponed.  She was found to have significant LV dysfunction and was initiated on goal-directed medical therapy as well as rate control and anticoagulation was changed from Eliquis to Xarelto.  She underwent repeat TEE in June with resolution of thrombus formation.  She underwent successful cardioversion and was initiated on amiodarone therapy.  She has been followed by Dr. Arna Medici and did have normalization of LV function.  She presents today for electrophysiology follow-up.    Ms. Boggess reports feeling better than she has in years.  She believes she has been maintaining sinus rhythm although is not very good about checking her blood pressure or pulse at home.  She denies signs or symptoms of bleeding but does admit to missing many of her medications on a regular basis.  She denies lower extremity edema or known weight gain.    Vitals:    09/12/21 1613 09/12/21 1614   BP: (!) 152/84    BP Source: Arm, Left Upper    Pulse: 64    SpO2: 95%    O2 Device:  Nasal cannula   O2 Liter Flow:  2 Lpm  Comment: at night   PainSc: Zero    Weight: 124.5 kg (274 lb 6.4 oz)    Height: 175.3 cm (5' 9)      Body mass index is 40.52 kg/m?Marland Kitchen     Past Medical History  Patient Active Problem List    Diagnosis Date Noted   ? 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 Thrombus of left atrial appendage 05/09/2021     04/06/2021 - TEE:  There is dense spontaneous echo contrast and a small sludge/forming thrombus at the apex of the left atrial appendage. LAA emptying velocity=20 cm/s.  Left atrium is mildly dilated.  Right atrium is severely dilated.  No color Doppler evidence of interatrial shunting.  Normal LV size with moderately reduced LV systolic function.  Visually estimated LVEF = 35%.  Right ventricle is moderately dilated with mildly reduced contractility.  Estimated PASP = 29 mmHg + RA pressure.  Mild mitral annular calcification with mild thickening/calcification of leaflets and chordae.  No stenosis.  Moderate MR.  Dilated tricuspid annulus with severe tricuspid regurgitation associated with reversed hepatic venous systolic flow.  Sclerotic and calcified aortic valve without significant stenosis or regurgitation.  No significant pericardial effusion.  Left pleural effusion was incidentally noted.         ? Chronic combined systolic and diastolic heart failure (HCC) 04/06/2021   ? Congestive heart failure, unspecified (HCC) 03/09/2021   ? Obesity, morbid (more than 100 lbs over  ideal weight or BMI > 40) (HCC) 02/09/2021   ? History of pulmonary embolism 05/28/2012   ? Syncope 01/14/2012   ? History of atrial flutter 01/14/2012     2004: RFA     ? AKI (acute kidney injury) (HCC) 01/14/2012   ? Chronic anticoagulation 01/14/2012   ? Iron deficiency anemia 01/13/2012   ? B12 deficiency 01/13/2012   ? Hypertension 07/12/2009   ? Supraventricular tachycardia (HCC) 07/12/2009   ? Hyperlipidemia 07/12/2009   ? Type II diabetes mellitus (HCC) 07/12/2009   ? Persistent atrial fibrillation (HCC) 04/12/2009 Review of Systems   Constitutional: Negative.   HENT: Negative.    Eyes: Negative.    Cardiovascular: Positive for chest pain (within the last couple weeks).   Respiratory: Positive for shortness of breath.    Endocrine: Negative.    Hematologic/Lymphatic: Negative.    Skin: Negative.    Musculoskeletal: Negative.    Gastrointestinal: Negative.    Genitourinary: Negative.    Neurological: Positive for loss of balance.   Psychiatric/Behavioral: Negative.    Allergic/Immunologic: Negative.        Physical Exam   Constitutional: She appears well-developed and well-nourished.   obese   HENT:   Head: Normocephalic and atraumatic.   Eyes: Conjunctivae are normal.   Cardiovascular: Normal rate, regular rhythm and normal heart sounds.   Pulmonary/Chest: Effort normal and breath sounds normal.   Musculoskeletal:         General: Edema present.      Comments: 2+ brawny edema/lymphedema    Neurological: She is alert and oriented to person, place, and time.   Skin: Skin is warm and dry.   Psychiatric: She has a normal mood and affect. Her behavior is normal.         Cardiovascular Studies  ECG in office today shows normal sinus rhythm at 71 bpm    Cardiovascular Health Factors  Vitals BP Readings from Last 3 Encounters:   09/12/21 (!) 152/84   08/15/21 132/80   08/11/21 (!) 177/78     Wt Readings from Last 3 Encounters:   09/12/21 124.5 kg (274 lb 6.4 oz)   08/15/21 121.6 kg (268 lb)   08/11/21 121.6 kg (268 lb)     BMI Readings from Last 3 Encounters:   09/12/21 40.52 kg/m?   08/15/21 39.58 kg/m?   08/11/21 39.58 kg/m?      Smoking Social History     Tobacco Use   Smoking Status Never Smoker   Smokeless Tobacco Never Used      Lipid Profile Cholesterol   Date Value Ref Range Status   04/06/2021 66 <200 MG/DL Final     HDL   Date Value Ref Range Status   04/06/2021 18 (L) >40 MG/DL Final     LDL   Date Value Ref Range Status   04/06/2021 43 <100 mg/dL Final     Triglycerides   Date Value Ref Range Status   04/06/2021 79 <150 MG/DL Final      Blood Sugar Hemoglobin A1C   Date Value Ref Range Status   04/05/2021 8.2 (H) 4.0 - 6.0 % Final     Comment:     The ADA recommends that most patients with type 1 and type 2 diabetes maintain   an A1c level <7%.       Glucose   Date Value Ref Range Status   07/18/2021 186 (H) 70 - 105 Final   06/15/2021 196 (H) 70 - 105 Final  06/01/2021 406 (H) 65 - 99 Final   03/26/2004 120 (H) 70 - 110 MG/DL Final   09/81/1914 782 (H) 70 - 110 MG/DL Final     Glucose, POC   Date Value Ref Range Status   05/15/2021 156 (H) 70 - 100 MG/DL Final   95/62/1308 657 (H) 70 - 100 MG/DL Final   84/69/6295 284 (H) 70 - 100 MG/DL Final   13/24/4010 66 (L) 70 - 100 MG/DL Final          Problems Addressed Today  Encounter Diagnoses   Name Primary?   ? Cardiovascular symptoms Yes   ? Bilateral pulmonary embolism (HCC)    ? Chronic combined systolic and diastolic heart failure (HCC)    ? Mixed hyperlipidemia    ? Primary hypertension    ? Persistent atrial fibrillation (HCC)    ? Thrombus of left atrial appendage    ? Chronic anticoagulation    ? History of pulmonary embolism    ? Type 2 diabetes mellitus with other specified complication, without long-term current use of insulin (HCC)    ? Iron deficiency anemia, unspecified iron deficiency anemia type    ? 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 Tachycardia induced cardiomyopathy (HCC)        Assessment and Plan  Ms. Bok appears to be doing quite well since her last visit with [place].  She has had normalization of LV function upon maintenance of sinus rhythm.  She remains on low-dose amiodarone therapy.  Given her relatively young age we would like to avoid long-term amiodarone use if possible.  We did discuss pulmonary vein isolation and patient is very interested in proceeding.  I have not made any adjustments to her regimen at this time and have arranged for her to undergo pulmonary vein isolation with Dr. Bradly Bienenstock in the next few months.  I will arrange for her to return to clinic to see Dr. Bradly Bienenstock prior.  Strongly suggest TEE prior to ablation as she admits to frequently missing doses of medications inadvertently.    Her blood pressure was elevated on exam today but states that it is typically much better when she is not at home.  She believes driving in the city tends to elevate her blood pressure.  I have asked that she continue to keep a close eye on this and aim for a reading of less than 140/80 consistently.    I have asked that she contact us for worsening of symptoms or new complaints in the interim.  She verbalized good understanding and agreement with this plan.    Jene Every, NP-C         Current Medications (including today's revisions)  ? acetaminophen (TYLENOL EXTRA STRENGTH) 500 mg tablet Take 1,000-1,500 mg by mouth every 4 hours as needed for Pain. Max of 4,000 mg of acetaminophen in 24 hours.   ? albuterol 0.5% (PROVENTIL) 2.5 mg/0.5 mL nebulizer solution Inhale 2.5 mg solution by nebulizer as directed every 6 hours as needed for Shortness of Breath or Wheezing.   ? alendronate (FOSAMAX) 70 mg tablet Take 70 mg by mouth every 7 days.   ? amiodarone (PACERONE) 200 mg tablet Take one tablet by mouth twice daily. Take 400mg  twice a day for 1 week, then decrease to 400mg  daily. (Patient taking differently: Take 200 mg by mouth daily.)   ? aspirin EC 81 mg tablet Take one tablet by mouth daily.  Take with food.   ? bumetanide (BUMEX) 1 mg tablet Take one tablet by mouth twice daily. (Patient taking differently: Take 1 mg by mouth daily.)   ? calcium carbonate/vitamin D-3 (OSCAL-500+D) 1250 mg/200 unit tablet Take 1 tablet by mouth twice daily. Calcium Carb 1250mg  delivers 500mg  elemental Ca   ? insulin glargine (LANTUS SOLOSTAR U-100 INSULIN) 100 unit/mL (3 mL) subcutaneous PEN Inject twenty Units under the skin at bedtime daily.   ? magnesium oxide (MAGOX) 400 mg (241.3 mg magnesium) tablet Take one tablet by mouth twice daily.   ? potassium chloride SR (K-DUR) 20 mEq tablet Take one tablet by mouth daily. Take with a meal and a full glass of water.   ? rivaroxaban (XARELTO) 20 mg tablet Take one tablet by mouth daily with dinner. Take with food.   ? rosuvastatin (CRESTOR) 20 mg tablet Take 1 tablet by mouth once daily   ? sacubitriL-valsartan (ENTRESTO) 97-103 mg tablet Take one tablet by mouth twice daily.   ? sertraline (ZOLOFT) 100 mg tablet Take 100 mg by mouth at bedtime daily.   ? vitamins, multiple tablet Take 1 Tab by mouth Daily.   ? Wheat Dextrin (BENEFIBER CLEAR) 3 gram/3.5 gram pwpk Dissolve 2-3 tsp in drink of choice and drink daily as needed

## 2021-09-12 NOTE — Patient Instructions
Plan for a-fib ablation with Dr. Bradly Bienenstock. Dr. Paulette Blanch nurses will reach out to arrange this.    An office visit with Dr. Bradly Bienenstock will be needed 2-3 months before the scheduled ablation date    Thank you for allowing [place] to participate in your care!    For NON-URGENT questions please contact us through your MyChart account.   For all medication refills please contact your pharmacy or send a request through MyChart.     For all questions that may need to be addressed urgently please call the nursing triage line at [phone] Monday - Friday 8am-3pm only.  Messages left after this timeframe will be addressed the next business day. Please leave a detailed message with your name, date of birth, and reason for your call. This number is not to be used for emergencies.    To schedule an appointment with an electrophysiology (EP) provider, call [phone], for general cardiology call [phone].     Please allow 10-14 business days for the results of any testing to be reviewed. Please call our office if you have not heard from a nurse within this time frame.    Lab and test results:  As a part of the CARES act, starting 02/18/2020, some results will be released to you via mychart immediately and automatically.  You may see results before your provider sees them; however, your provider will review all these results and then they, or one of their team, will notify you of result information and recommendations.   Critical results will be addressed immediately, but otherwise, please allow [place] time to get back with you prior to you reaching out to [place] for questions.  This will usually take about 72 hours for labs and 5-7 days for procedure test results.

## 2021-09-13 ENCOUNTER — Encounter: Admit: 2021-09-13 | Discharge: 2021-09-13 | Payer: MEDICARE

## 2021-09-13 DIAGNOSIS — I4891 Unspecified atrial fibrillation: Secondary | ICD-10-CM

## 2021-09-13 DIAGNOSIS — I4819 Other persistent atrial fibrillation: Secondary | ICD-10-CM

## 2021-09-13 MED ORDER — LIDOCAINE HCL 2 % MM JELP
Freq: Once | TOPICAL | 0 refills
Start: 2021-09-13 — End: ?

## 2021-09-13 MED ORDER — PANTOPRAZOLE 40 MG PO TBEC
ORAL_TABLET | Freq: Two times a day (BID) | ORAL | 0 refills | 90.00000 days | Status: AC
Start: 2021-09-13 — End: ?

## 2021-09-13 MED ORDER — LIDOCAINE (PF) 10 MG/ML (1 %) IJ SOLN
.1-2 mL | INTRAMUSCULAR | 0 refills | PRN
Start: 2021-09-13 — End: ?

## 2021-09-13 NOTE — Patient Instructions
ELECTROPHYSIOLOGY PRE-ADMISSION INSTRUCTIONS  Atrial Fibrillation Ablation    Patient Name: Sandra Parks  MRN#: [id number]  Date of Birth: 10-07-1946 (75 y.o.)  Today's Date: 09/13/2021    PROCEDURE:  You are scheduled for possible Transesophageal Echocardiogram followed by  Electrophysiology Study and Radiofrequency Ablation of your Atrial Fibrillation with Dr. Christain [place].Pimentel.    ARRIVAL TIME:  Please report to the [facility] admitting office on the ground floor of the [facility] on: 12/22    The [facility] is located at [address], [place], [place] [address]. Park in [employer] and enter through the  Main front doors to the hospital. The Heart Center is located on the right-hand side as soon as you walk in.    The EP Lab will call to notify you of your arrival time.  They will call on the business day prior to your procedure.  (If you have any questions regarding your arrival time for the Electrophysiology Lab, please call the EP Lab at [phone].)    PRE-PROCEDURE APPOINTMENTS:  12/6 at 1130 Office visit to update history and physical (requirement within 30 days of procedure)  with Dr. Prudencio Burly C.Pimentel at [facility]    12/6 at 1230 Pre-Operative Assessment Clinic visit with Anesthesia Department.  Go to the main hospital entrance of the [facility]. The anesthesia clinic is just inside the entrance on the left side of lobby -  across from the Information Desk.   12/6 CT scan of your heart is planned. They will send separate instructions to you in the mail.   12/6 Pre-Admission lab work: BMP, CBC, and Magnesium at your Pre-Operative Assessment Visit.     SPECIAL MEDICATION INSTRUCTIONS  Nothing to eat or drink after midnight. You may take approved medications with a small sip of water on the morning of the procedure.     You will be starting a prescription for Protonix (pantoprazole sodium) 40mg  twice daily  one week prior to the procedure and for 30 days after your procedure.    Any new prescriptions will be sent to your pharmacy listed on file with [place].   HOLD ALL over the counter vitamins or supplements on the morning of your procedure.  Antiarrythmics: Cordarone, Pacerone (amiodarone) -- hold for 72 hours prior to your procedure. LAST DOSE: 12/18  Diuretics: Bumex (bumetanide) -- hold the morning of your procedure.   Insulin: lantus- take 80% of your standard dose the night prior to procedure  ACE/ARBs: Diovan (valsartan) -- hold the morning of your procedure.   Anticoagulants: Do not take rivaroxaban (Xarelto) on the morning of your procedure. --- DO NOT MISS ANY OTHER DOSES ---  If you miss any doses of rivaroxaban (Xarelto) prior to your procedure, please contact our office. You may require a transesophageal echo prior to your procedure.       Additional Instructions  If you wear CPAP, please bring your mask and machine with you to the hospital.    Take a bath or shower with anti-bacterial soap the evening before, or the morning of the procedure. We will give this to you at your office visit.     Bring photo ID and your health insurance card(s).    Arrange for a driver to take you home from the hospital.    Bring an accurate list of your current medications with you to the hospital (all meds and supplements taken daily).    Wear comfortable  clothes and don't bring valuables, other than photo identification card, with you to the hospital.    Please pack a bag for an overnight stay.     You will have a Foley Catheter placed in your bladder during the procedure that will be removed once you are off bedrest after the procedure.     Your groins will be prepped with clippers in the hospital to prep the access site for your procedure.    You may have an access site on the right side of your neck. You may wake up post procedure with a bulky dressing in this area.    It is common for your heart to go in and out of atrial fibrillation for up to three months. If you are having symptoms of Atrial Fibrillation longer than 6 hours or are having new symptoms with an irregular rhythm, please call the office. If it lasts more than 24 hours you may need a Cardioversion.     Please review your pre-procedure instructions and call the office at [phone] with any questions. You may ask to speak with any of Dr. Prudencio Burly C.Pimentel's nurses. There are several of [place] in the office who can assist you. You will have a 1 week follow up phone call by one of Dr. Prudencio Burly C.Pimentel's nurses just to check in with you. For questions regarding post procedure care or restrictions please refer to the Your Care Instructions included in the Atrial Fibrillation Ablation Packet.     ALLERGIES  Allergies   Allergen Reactions    Ceftriaxone HIVES     Has tolerated amoxicillin per pt report    Ciprocinonide HIVES    Sulfa (Sulfonamide Antibiotics) HIVES       CURRENT MEDICATIONS  Outpatient Encounter Medications as of 09/13/2021   Medication Sig Dispense Refill    acetaminophen (TYLENOL EXTRA STRENGTH) 500 mg tablet Take 1,000-1,500 mg by mouth every 4 hours as needed for Pain. Max of 4,000 mg of acetaminophen in 24 hours.      albuterol 0.5% (PROVENTIL) 2.5 mg/0.5 mL nebulizer solution Inhale 2.5 mg solution by nebulizer as directed every 6 hours as needed for Shortness of Breath or Wheezing.      alendronate (FOSAMAX) 70 mg tablet Take 70 mg by mouth every 7 days.      amiodarone (PACERONE) 200 mg tablet Take one tablet by mouth twice daily. Take 400mg  twice a day for 1 week, then decrease to 400mg  daily. (Patient taking differently: Take 200 mg by mouth daily.) 90 tablet 3    aspirin EC 81 mg tablet Take one tablet by mouth daily. Take with food. 90 tablet 1    bumetanide (BUMEX) 1 mg tablet Take one tablet by mouth twice daily. (Patient taking differently: Take 1 mg by mouth daily.) 90 tablet 1    calcium carbonate/vitamin D-3 (OSCAL-500+D) 1250 mg/200 unit tablet Take 1 tablet by mouth twice daily. Calcium Carb 1250mg  delivers 500mg  elemental Ca      insulin glargine (LANTUS SOLOSTAR U-100 INSULIN) 100 unit/mL (3 mL) subcutaneous PEN Inject twenty Units under the skin at bedtime daily. 45 mL     magnesium oxide (MAGOX) 400 mg (241.3 mg magnesium) tablet Take one tablet by mouth twice daily. 180 tablet 0    potassium chloride SR (K-DUR) 20 mEq tablet Take one tablet by mouth daily. Take with a meal and a full glass of water. 90 tablet 3    rivaroxaban (XARELTO) 20 mg tablet Take one tablet by mouth  daily with dinner. Take with food. 90 tablet 1    rosuvastatin (CRESTOR) 20 mg tablet Take 1 tablet by mouth once daily 90 tablet 3    sacubitriL-valsartan (ENTRESTO) 97-103 mg tablet Take one tablet by mouth twice daily. 180 tablet 3    sertraline (ZOLOFT) 100 mg tablet Take 100 mg by mouth at bedtime daily.      vitamins, multiple tablet Take 1 Tab by mouth Daily.      Wheat Dextrin (BENEFIBER CLEAR) 3 gram/3.5 gram pwpk Dissolve 2-3 tsp in drink of choice and drink daily as needed       No facility-administered encounter medications on file as of 09/13/2021.     _________________________________________  Form completed by: Cline Crock, RN  Date completed: 09/13/21  Method: Via MyChart.

## 2021-09-14 ENCOUNTER — Encounter: Admit: 2021-09-14 | Discharge: 2021-09-14 | Payer: MEDICARE

## 2021-09-14 NOTE — Telephone Encounter
-----   Message from Cline Crock, RN sent at 09/13/2021  1:56 PM CDT -----  Patient needing ccta for AF RFA. On 12/6 to coordinate with other appts. Seeing [place] at 1130 and pat at 1230 thanks

## 2021-10-04 ENCOUNTER — Encounter: Admit: 2021-10-04 | Discharge: 2021-10-04 | Payer: MEDICARE

## 2021-10-04 NOTE — Progress Notes
Medicare is listed as patient's primary insurance coverage.  Pre-certification is not required for hospitalizations.

## 2021-10-16 ENCOUNTER — Encounter: Admit: 2021-10-16 | Discharge: 2021-10-16 | Payer: MEDICARE

## 2021-10-16 NOTE — Patient Instructions
Coronary CT Angiography Instructions    Sandra Parks  [id number]  [birth year]  10/16/2021    ARRIVAL TIME    Please report to the Cardiovascular Medicine Clinic at the [facility] on: 10/24/21  Please arrive at the following time: 10:30 for pre ablation cta  11:30 office visit  12:30 pre anesthesia assessment       DO NOT EAT FOR 4 HOURS PRIOR TO YOUR PROCEDURE.  YOU SHOULD DRINK PLENTY OF CLEAR LIQUIDS UP TO ARRIVAL AT OFFICE.    Pre-Procedure Heart Rate Medication Instructions: take all AM meds It is important to take these medications exactly as written.  These medications prepare you for the procedure.                                      Do not take any non-steroidal inflammatory medications, (ibuprofen, Aleve, Advil, etc.) for 48 hours beginning the day of the procedure.    Hold All Diuretics For 24 Hours Beginning The Day Of The Procedure: Verified    You May Take All Other Medications With Water The Morning Of The Procedure: Verified              ALLERGIES    Allergies   Allergen Reactions    Ceftriaxone HIVES     Has tolerated amoxicillin per pt report    Ciprocinonide HIVES    Sulfa (Sulfonamide Antibiotics) HIVES       SPECIAL ALLERGY INSTRUCTIONS         CURRENT MEDICATIONS  Outpatient Encounter Medications as of 10/16/2021   Medication Sig Dispense Refill    acetaminophen (TYLENOL EXTRA STRENGTH) 500 mg tablet Take 1,000-1,500 mg by mouth every 4 hours as needed for Pain. Max of 4,000 mg of acetaminophen in 24 hours.      albuterol 0.5% (PROVENTIL) 2.5 mg/0.5 mL nebulizer solution Inhale 2.5 mg solution by nebulizer as directed every 6 hours as needed for Shortness of Breath or Wheezing.      alendronate (FOSAMAX) 70 mg tablet Take 70 mg by mouth every 7 days.      amiodarone (PACERONE) 200 mg tablet Take one tablet by mouth twice daily. Take 400mg  twice a day for 1 week, then decrease to 400mg  daily. (Patient taking differently: Take 200 mg by mouth daily.) 90 tablet 3 aspirin EC 81 mg tablet Take one tablet by mouth daily. Take with food. 90 tablet 1    bumetanide (BUMEX) 1 mg tablet Take one tablet by mouth twice daily. (Patient taking differently: Take 1 mg by mouth daily.) 90 tablet 1    calcium carbonate/vitamin D-3 (OSCAL-500+D) 1250 mg/200 unit tablet Take 1 tablet by mouth twice daily. Calcium Carb 1250mg  delivers 500mg  elemental Ca      insulin glargine (LANTUS SOLOSTAR U-100 INSULIN) 100 unit/mL (3 mL) subcutaneous PEN Inject twenty Units under the skin at bedtime daily. 45 mL     magnesium oxide (MAGOX) 400 mg (241.3 mg magnesium) tablet Take one tablet by mouth twice daily. 180 tablet 0    pantoprazole DR (PROTONIX) 40 mg tablet Take 1 tablet twice daily 1 week before procedure and 69month after procedure 74 tablet 0    potassium chloride SR (K-DUR) 20 mEq tablet Take one tablet by mouth daily. Take with a meal and a full glass of water. 90 tablet 3    rivaroxaban (XARELTO) 20 mg tablet Take one tablet by  mouth daily with dinner. Take with food. 90 tablet 1    rosuvastatin (CRESTOR) 20 mg tablet Take 1 tablet by mouth once daily 90 tablet 3    sacubitriL-valsartan (ENTRESTO) 97-103 mg tablet Take one tablet by mouth twice daily. 180 tablet 3    sertraline (ZOLOFT) 100 mg tablet Take 100 mg by mouth at bedtime daily.      vitamins, multiple tablet Take 1 Tab by mouth Daily.      Wheat Dextrin (BENEFIBER CLEAR) 3 gram/3.5 gram pwpk Dissolve 2-3 tsp in drink of choice and drink daily as needed       No facility-administered encounter medications on file as of 10/16/2021.       -No caffeine or smoking after midnight before the procedure.  -Please arrange for someone to drive you home after the procedure.  -No driving for 3 hours after the procedure.  -Please leave all valuables at home (wallet/purse).              PRE-PROCEDURE LAB WORK    Other Lab: [facility]    If you have any questions, please call the [facility] office at [phone] or [phone].    Form Completed By: Kevan Ny RN  Date Completed: 10/16/21

## 2021-10-24 ENCOUNTER — Ambulatory Visit: Admit: 2021-10-24 | Discharge: 2021-10-24 | Payer: MEDICARE

## 2021-10-24 ENCOUNTER — Encounter: Admit: 2021-10-24 | Discharge: 2021-10-24 | Payer: MEDICARE

## 2021-10-24 DIAGNOSIS — I4819 Other persistent atrial fibrillation: Secondary | ICD-10-CM

## 2021-10-24 DIAGNOSIS — I4892 Unspecified atrial flutter: Secondary | ICD-10-CM

## 2021-10-24 DIAGNOSIS — I10 Essential (primary) hypertension: Secondary | ICD-10-CM

## 2021-10-24 DIAGNOSIS — Z9981 Dependence on supplemental oxygen: Secondary | ICD-10-CM

## 2021-10-24 DIAGNOSIS — E119 Type 2 diabetes mellitus without complications: Secondary | ICD-10-CM

## 2021-10-24 DIAGNOSIS — I509 Heart failure, unspecified: Secondary | ICD-10-CM

## 2021-10-24 DIAGNOSIS — E785 Hyperlipidemia, unspecified: Secondary | ICD-10-CM

## 2021-10-24 DIAGNOSIS — E538 Deficiency of other specified B group vitamins: Secondary | ICD-10-CM

## 2021-10-24 DIAGNOSIS — I480 Paroxysmal atrial fibrillation: Secondary | ICD-10-CM

## 2021-10-24 DIAGNOSIS — I471 Supraventricular tachycardia: Secondary | ICD-10-CM

## 2021-10-24 DIAGNOSIS — I2699 Other pulmonary embolism without acute cor pulmonale: Secondary | ICD-10-CM

## 2021-10-24 DIAGNOSIS — R0989 Other specified symptoms and signs involving the circulatory and respiratory systems: Secondary | ICD-10-CM

## 2021-10-24 DIAGNOSIS — Z7901 Long term (current) use of anticoagulants: Secondary | ICD-10-CM

## 2021-10-24 DIAGNOSIS — I5042 Chronic combined systolic (congestive) and diastolic (congestive) heart failure: Secondary | ICD-10-CM

## 2021-10-24 DIAGNOSIS — F99 Mental disorder, not otherwise specified: Secondary | ICD-10-CM

## 2021-10-24 DIAGNOSIS — D509 Iron deficiency anemia, unspecified: Secondary | ICD-10-CM

## 2021-10-24 DIAGNOSIS — M1990 Unspecified osteoarthritis, unspecified site: Secondary | ICD-10-CM

## 2021-10-24 DIAGNOSIS — I493 Ventricular premature depolarization: Secondary | ICD-10-CM

## 2021-10-24 DIAGNOSIS — Z01818 Encounter for other preprocedural examination: Secondary | ICD-10-CM

## 2021-10-24 LAB — POC CREATININE, RAD: CREATININE, POC: 1.3 mg/dL — ABNORMAL HIGH (ref 0.4–1.00)

## 2021-10-24 MED ORDER — SODIUM CHLORIDE 0.9 % IJ SOLN
100 mL | Freq: Once | INTRAVENOUS | 0 refills | Status: AC
Start: 2021-10-24 — End: ?

## 2021-10-24 MED ORDER — SODIUM CHLORIDE 0.9 % IV SOLP
250 mL | INTRAVENOUS | 0 refills | Status: CP
Start: 2021-10-24 — End: ?
  Administered 2021-10-24: 17:00:00 250 mL via INTRAVENOUS

## 2021-10-24 MED ORDER — IOPAMIDOL 370 MG IODINE /ML (76 %) IV SOLN
80 mL | Freq: Once | INTRAVENOUS | 0 refills | Status: CP
Start: 2021-10-24 — End: ?
  Administered 2021-10-24: 17:00:00 80 mL via INTRAVENOUS

## 2021-10-24 NOTE — Progress Notes
Date of Service: 10/24/2021    Sandra Parks is a 75 y.o. female.       HPI     Sandra Parks presents to my office today in electrophysiology follow-up for history of atrial arrhythmias.  As you know, she is a pleasant 75 year old female with a past medical history of persistent atrial fibrillation, hypertension, hyperlipidemia, prior SVT, diabetes mellitus, morbid obesity, and frequent ventricular ectopy who was last seen in the office by me in June.  At that time, she had evidence of left atrial appendage clot by TEE.  She eventually ended up undergoing TEE guided cardioversion and was started on amiodarone.  Of note, Sandra Parks has a strong family history of atrial fibrillation.    Sandra Parks returns to my office today in preparation for planned atrial fibrillation ablation.  She denies chest discomfort, shortness of breath, dizziness, or syncope.  She has had no palpitations.  Her energy level is poor but stable.  Sandra Parks has never felt her atrial fibrillation.  Her twelve-lead EKG today shows sinus rhythm with ventricular bigeminy.       Vitals:    10/24/21 1153   BP: (!) 156/80   BP Source: Arm, Left Upper   Pulse: 76   SpO2: 97%   O2 Device: Nasal cannula   O2 Liter Flow: 2 Lpm  Comment: at night   PainSc: Zero   Weight: 123.1 kg (271 lb 6.4 oz)   Height: 175.3 cm (5' 9)     Body mass index is 40.08 kg/m?Marland Kitchen     Past Medical History  Patient Active Problem List    Diagnosis Date Noted   ? Ventricular ectopy 10/24/2021   ? 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 Thrombus of left atrial appendage 05/09/2021     04/06/2021 - TEE:  There is dense spontaneous echo contrast and a small sludge/forming thrombus at the apex of the left atrial appendage. LAA emptying velocity=20 cm/s.  Left atrium is mildly dilated.  Right atrium is severely dilated.  No color Doppler evidence of interatrial shunting.  Normal LV size with moderately reduced LV systolic function.  Visually estimated LVEF = 35%.  Right ventricle is moderately dilated with mildly reduced contractility.  Estimated PASP = 29 mmHg + RA pressure.  Mild mitral annular calcification with mild thickening/calcification of leaflets and chordae.  No stenosis.  Moderate MR.  Dilated tricuspid annulus with severe tricuspid regurgitation associated with reversed hepatic venous systolic flow.  Sclerotic and calcified aortic valve without significant stenosis or regurgitation.  No significant pericardial effusion.  Left pleural effusion was incidentally noted.         ? Chronic combined systolic and diastolic heart failure (HCC) 04/06/2021   ? Congestive heart failure, unspecified (HCC) 03/09/2021   ? 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 History of pulmonary embolism 05/28/2012   ? Syncope 01/14/2012   ? History of atrial flutter 01/14/2012     2004: RFA     ? AKI (acute kidney injury) (HCC) 01/14/2012   ? Chronic anticoagulation 01/14/2012   ? Iron deficiency anemia 01/13/2012   ? B12 deficiency 01/13/2012   ? Hypertension 07/12/2009   ? Supraventricular tachycardia (HCC) 07/12/2009   ? Hyperlipidemia 07/12/2009   ? Type II diabetes mellitus (HCC) 07/12/2009   ? Persistent atrial fibrillation (HCC) 04/12/2009         Review of Systems   Constitutional: Negative.  HENT: Negative.    Eyes: Negative.    Cardiovascular: Negative.    Respiratory: Negative.    Endocrine: Negative.    Hematologic/Lymphatic: Negative.    Skin: Negative.    Musculoskeletal: Negative.    Gastrointestinal: Negative.    Genitourinary: Negative.    Neurological: Negative.    Psychiatric/Behavioral: Negative.    Allergic/Immunologic: Negative.        Physical Exam  GEN: morbidly obese, well nourished, in no acute distress  HEENT: unremarkable, no JVD  CHEST: clear to auscultation bilaterally  CV: Reg rhythm, nml rate; frequent ectopy, nml S1 & S2, no S3 or S4; no rub; no murmurs  ABD: soft, nontender, non-distended, positive bowel sounds  EXT: no clubbing, cyanosis, or edema,1+ distal pulses  NEURO: alert and oriented x3, no focal deficits      Cardiovascular Studies  12 lead EKG:  Sinus rhythm, ventricular rate 76 bpm, QTc ventricular bigeminy, QRSd 100 msec      Cardiovascular Health Factors  Vitals BP Readings from Last 3 Encounters:   10/24/21 (!) 156/80   09/12/21 (!) 152/84   08/15/21 132/80     Wt Readings from Last 3 Encounters:   10/24/21 123.1 kg (271 lb 6.4 oz)   09/12/21 124.5 kg (274 lb 6.4 oz)   08/15/21 121.6 kg (268 lb)     BMI Readings from Last 3 Encounters:   10/24/21 40.08 kg/m?   09/12/21 40.52 kg/m?   08/15/21 39.58 kg/m?      Smoking Social History     Tobacco Use   Smoking Status Never   Smokeless Tobacco Never      Lipid Profile Cholesterol   Date Value Ref Range Status   04/06/2021 66 <200 MG/DL Final     HDL   Date Value Ref Range Status   04/06/2021 18 (L) >40 MG/DL Final     LDL   Date Value Ref Range Status   04/06/2021 43 <100 mg/dL Final     Triglycerides   Date Value Ref Range Status   04/06/2021 79 <150 MG/DL Final      Blood Sugar Hemoglobin A1C   Date Value Ref Range Status   04/05/2021 8.2 (H) 4.0 - 6.0 % Final     Comment:     The ADA recommends that Parks patients with type 1 and type 2 diabetes maintain   an A1c level <7%.       Glucose   Date Value Ref Range Status   07/18/2021 186 (H) 70 - 105 Final   06/15/2021 196 (H) 70 - 105 Final   06/01/2021 406 (H) 65 - 99 Final   03/26/2004 120 (H) 70 - 110 MG/DL Final   16/08/9603 540 (H) 70 - 110 MG/DL Final     Glucose, POC   Date Value Ref Range Status   05/15/2021 156 (H) 70 - 100 MG/DL Final   98/09/9146 829 (H) 70 - 100 MG/DL Final   56/21/3086 578 (H) 70 - 100 MG/DL Final   46/96/2952 66 (L) 70 - 100 MG/DL Final          Problems Addressed Today  Encounter Diagnoses   Name Primary?   ? Persistent atrial fibrillation (HCC) Yes   ? Cardiovascular symptoms    ? Ventricular ectopy    ? Chronic combined systolic and diastolic heart failure (HCC)        Assessment and Plan       Persistent atrial fibrillation (HCC)  She is in sinus  rhythm today with ventricular bigeminy.  Sandra Parks does not feel her atrial arrhythmias.  She has been compliant with her Xarelto and claims that she has not missed a dose since her last office visit here.    We discussed multiple therapeutic options for the treatment of her atrial fibrillation, including undergoing an atrial fibrillation/left atrial antral isolation ablation. The details of the procedure and risks associated with undergoing an atrial fibrillation/LAA ablation were discussed in detail including, but not limited to, death, myocardial ischemia, stroke, cardiac perforation, pulmonary vein stenosis, diaphragmatic paralysis via phrenic nerve injury, catheter entrapment in the mitral valve or other location, bleeding, infection, deep vein thrombosis, vascular injury, and worsening atrial arrhythmias. We also discussed that there is a low risk of possible esophageal ulceration, vagal nerve injury, atrial esophageal fistula, atrial bronchial fistula, or another complication requiring the need for major surgery to address.    We also discussed that recurrent atrial fibrillation in the first 2-3 months post procedure is a part of the healing process and has no impact on the overall longer term success of the ablation. To try to reduce the incidence of these events, the plan will be for antiarrhythmic therapy to be restarted post procedure and continued for the first 3 months after ablation.      We discussed that about 20-30% of patients will have recurrence of atrial fibrillation after the immediate post procedure period requiring the possible need for a repeat ablation procedure.  Also discussed was that the 5 year cure rate of the procedure for her is approximately 60-70% with a higher rate if a repeat procedure is performed.     All questions were answered to her satisfaction and she expressed verbal understanding of the procedure, the benefits, and risks. Sandra Parks wishes to proceed with undergoing atrial fibrillation/left atrial antral isolation ablation.         We will plan on radiofrequency ablation of her atrial fibrillation.  She will remain on amiodarone for a month after ablation.    Ventricular ectopy  Twelve-lead EKG today shows sinus rhythm with ventricular bigeminy.  Again, she does not feel these episodes.  I think we will tackle her A. fib first.  If she continues to have frequent PVCs, we could consider PVC ablation.    Chronic combined systolic and diastolic heart failure (HCC)  She has a history of tachycardia induced cardiomyopathy although last echocardiogram done in September showed normalization of LV function with an EF of 55%.  Her left atrium was moderately dilated.    We will keep you up to date with results of procedures as they occur.        Current Medications (including today's revisions)  ? acetaminophen (TYLENOL EXTRA STRENGTH) 500 mg tablet Take 1,000-1,500 mg by mouth every 4 hours as needed for Pain. Max of 4,000 mg of acetaminophen in 24 hours.   ? albuterol 0.5% (PROVENTIL) 2.5 mg/0.5 mL nebulizer solution Inhale 2.5 mg solution by nebulizer as directed every 6 hours as needed for Shortness of Breath or Wheezing.   ? alendronate (FOSAMAX) 70 mg tablet Take 70 mg by mouth every 7 days.   ? amiodarone (PACERONE) 200 mg tablet Take one tablet by mouth twice daily. Take 400mg  twice a day for 1 week, then decrease to 400mg  daily. (Patient taking differently: Take 200 mg by mouth daily.)   ? aspirin EC 81 mg tablet Take one tablet by mouth daily. Take with food.   ? bumetanide (BUMEX) 1 mg  tablet Take one tablet by mouth twice daily. (Patient taking differently: Take 1 mg by mouth daily.)   ? calcium carbonate/vitamin D-3 (OSCAL-500+D) 1250 mg/200 unit tablet Take 1 tablet by mouth twice daily. Calcium Carb 1250mg  delivers 500mg  elemental Ca   ? insulin glargine (LANTUS SOLOSTAR U-100 INSULIN) 100 unit/mL (3 mL) subcutaneous PEN Inject twenty Units under the skin at bedtime daily.   ? magnesium oxide (MAGOX) 400 mg (241.3 mg magnesium) tablet Take one tablet by mouth twice daily.   ? pantoprazole DR (PROTONIX) 40 mg tablet Take 1 tablet twice daily 1 week before procedure and 1month after procedure   ? potassium chloride SR (K-DUR) 20 mEq tablet Take one tablet by mouth daily. Take with a meal and a full glass of water.   ? rivaroxaban (XARELTO) 20 mg tablet Take one tablet by mouth daily with dinner. Take with food.   ? rosuvastatin (CRESTOR) 20 mg tablet Take 1 tablet by mouth once daily   ? sacubitriL-valsartan (ENTRESTO) 97-103 mg tablet Take one tablet by mouth twice daily.   ? sertraline (ZOLOFT) 100 mg tablet Take 100 mg by mouth at bedtime daily.   ? vitamins, multiple tablet Take 1 Tab by mouth Daily.   ? Wheat Dextrin (BENEFIBER CLEAR) 3 gram/3.5 gram pwpk Dissolve 2-3 tsp in drink of choice and drink daily as needed

## 2021-10-24 NOTE — Assessment & Plan Note
Twelve-lead EKG today shows sinus rhythm with ventricular bigeminy.  Again, she does not feel these episodes.  I think we will tackle her A. fib first.  If she continues to have frequent PVCs, we could consider PVC ablation.

## 2021-10-24 NOTE — Assessment & Plan Note
She is in sinus rhythm today with ventricular bigeminy.  Mrs. Elisabeth Most does not feel her atrial arrhythmias.  She has been compliant with her Xarelto and claims that she has not missed a dose since her last office visit here.    We discussed multiple therapeutic options for the treatment of her atrial fibrillation, including undergoing an atrial fibrillation/left atrial antral isolation ablation. The details of the procedure and risks associated with undergoing an atrial fibrillation/LAA ablation were discussed in detail including, but not limited to, death, myocardial ischemia, stroke, cardiac perforation, pulmonary vein stenosis, diaphragmatic paralysis via phrenic nerve injury, catheter entrapment in the mitral valve or other location, bleeding, infection, deep vein thrombosis, vascular injury, and worsening atrial arrhythmias. We also discussed that there is a low risk of possible esophageal ulceration, vagal nerve injury, atrial esophageal fistula, atrial bronchial fistula, or another complication requiring the need for major surgery to address.    We also discussed that recurrent atrial fibrillation in the first 2-3 months post procedure is a part of the healing process and has no impact on the overall longer term success of the ablation. To try to reduce the incidence of these events, the plan will be for antiarrhythmic therapy to be restarted post procedure and continued for the first 3 months after ablation.      We discussed that about 20-30% of patients will have recurrence of atrial fibrillation after the immediate post procedure period requiring the possible need for a repeat ablation procedure.  Also discussed was that the 5 year cure rate of the procedure for her is approximately 60-70% with a higher rate if a repeat procedure is performed.     All questions were answered to her satisfaction and she expressed verbal understanding of the procedure, the benefits, and risks. Ms. Batte wishes to proceed with undergoing atrial fibrillation/left atrial antral isolation ablation.         We will plan on radiofrequency ablation of her atrial fibrillation.  She will remain on amiodarone for a month after ablation.

## 2021-10-24 NOTE — Assessment & Plan Note
She has a history of tachycardia induced cardiomyopathy although last echocardiogram done in September showed normalization of LV function with an EF of 55%.  Her left atrium was moderately dilated.

## 2021-10-24 NOTE — Anesthesia Pre-Procedure Evaluation
Anesthesia Pre-Procedure Evaluation    Name: Sandra Parks      MRN: [id number]     DOB: Dec 16, 1945     Age: 75 y.o.     Sex: female   _________________________________________________________________________     Procedure Info:   Procedure Information     Date/Time: 11/09/21 0735    Procedures:       INTRACARDIAC CATHETER ABLATION WITH COMPREHENSIVE ELECTROPHYSIOLOGIC EVALUATION - ATRIAL FIBRILLATION, RADIOFREQUENCY      INTRACARDIAC ELECTROPHYSIOLOGIC 3-DIMENSIONAL MAPPING      INTRACARDIAC ECHOCARDIOGRAPHY      POSSIBLE INTRAVENOUS DRUG INFUSION FOR STIMULATION AND PACING      POSSIBLE INSERTION ESOPHAGEAL DEVIATOR      POSSIBLE EXTERNAL CARDIOVERSION      TRANSESOPHAGEAL ECHOCARDIOGRAM DURING INTERVENTION    Location: [facility]    Providers: Kathreen Cornfield, MD; Cath, Physician          Physical Assessment  Vital Signs (last filed in past 24 hours):  BP: 169/86 (12/06 1309)  Temp: 36.2 ?C (97.2 ?F) (12/06 1309)  Pulse: 66 (12/06 1309)  Respirations: 18 PER MINUTE (12/06 1309)  SpO2: 97 % (12/06 1309)  O2 Device: None (Room air) (12/06 1309)  O2 Liter Flow: 2 Lpm (12/06 1153)  Height: 175.3 cm (5' 9) (12/06 1309)  Weight: 122.7 kg (270 lb 9.6 oz) (12/06 1309)      Patient History   Allergies   Allergen Reactions   ? Ceftriaxone HIVES     Has tolerated amoxicillin per pt report   ? Ciprocinonide HIVES   ? Sulfa (Sulfonamide Antibiotics) HIVES        Current Medications    Medication Directions   albuterol 0.5% (PROVENTIL) 2.5 mg/0.5 mL nebulizer solution Inhale 2.5 mg solution by nebulizer as directed every 6 hours as needed for Shortness of Breath or Wheezing.   alendronate (FOSAMAX) 70 mg tablet Take 70 mg by mouth every 7 days.   amiodarone (PACERONE) 200 mg tablet Take one tablet by mouth twice daily. Take 400mg  twice a day for 1 week, then decrease to 400mg  daily.  Patient taking differently: Take 200 mg by mouth at bedtime daily.   aspirin EC 81 mg tablet Take one tablet by mouth daily. Take with food.   bumetanide (BUMEX) 1 mg tablet Take one tablet by mouth twice daily.  Patient taking differently: Take 1 mg by mouth daily.   calcium carbonate/vitamin D-3 (OSCAL-500+D) 1250 mg/200 unit tablet Take 1 tablet by mouth twice daily. Calcium Carb 1250mg  delivers 500mg  elemental Ca   insulin glargine (LANTUS SOLOSTAR U-100 INSULIN) 100 unit/mL (3 mL) subcutaneous PEN Inject twenty Units under the skin at bedtime daily.  Patient taking differently: Inject 25 Units under the skin at bedtime daily.   magnesium oxide (MAGOX) 400 mg (241.3 mg magnesium) tablet Take one tablet by mouth twice daily.   pantoprazole DR (PROTONIX) 40 mg tablet Take 1 tablet twice daily 1 week before procedure and 32month after procedure   potassium chloride SR (K-DUR) 20 mEq tablet Take one tablet by mouth daily. Take with a meal and a full glass of water.   rivaroxaban (XARELTO) 20 mg tablet Take one tablet by mouth daily with dinner. Take with food.   rosuvastatin (CRESTOR) 20 mg tablet Take 1 tablet by mouth once daily  Patient taking differently: Take 20 mg by mouth at bedtime daily.   sacubitriL-valsartan (ENTRESTO) 97-103 mg tablet Take one tablet by mouth twice daily.  sertraline (ZOLOFT) 100 mg tablet Take 100 mg by mouth at bedtime daily.   vitamins, multiple tablet Take 1 Tab by mouth Daily.   Wheat Dextrin (BENEFIBER CLEAR) 3 gram/3.5 gram pwpk Dissolve 2-3 tsp in drink of choice and drink daily as needed         Review of Systems/Medical History      Patient summary reviewed  Nursing notes reviewed  Pertinent labs reviewed    PONV Screening: Female gender and Non-smoker  No history of anesthetic complications  No family history of anesthetic complications        Pulmonary       Not a current smoker        Asthma well controlled    no COPD      Hx pulmonary embolus      Home oxygen use (At night 2L)      No sleep apnea      Cardiovascular         Exercise tolerance: >4 METS (4.3 per Dasi)       Beta Blocker therapy: Yes      Beta blockers within 24 hours: Yes        Hypertension, well controlled      No hx of coronary artery disease        Dysrhythmias (On Xarelto ); atrial fibrillation      CHF      Hyperlipidemia      Dyspnea on exertion (non pitting edema on spironolactone.)      GI/Hepatic/Renal           GERD, well controlled      No hx of liver disease       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    Abdominal Findings:       Obese    Neurological Findings: Negative      Alert and oriented x 3      Comments: Wears glasses    Constitutional findings:       No acute distress      Well-developed      Well-nourished       Diagnostic Tests  Hematology:   Lab Results   Component Value Date    HGB 10.7 04/25/2021    HCT 36.2 04/25/2021    PLTCT 214 04/25/2021    WBC 4.8 04/25/2021    NEUT 50 04/07/2021  ANC 2.00 04/07/2021    ALC 1.37 04/07/2021    MONA 14 04/07/2021    AMC 0.54 04/07/2021    EOSA 0 04/07/2021    ABC 0.04 04/07/2021    MCV 88.6 04/25/2021    MCH 26.1 04/25/2021    MCHC 29.5 04/25/2021    MPV 8.8 04/07/2021    RDW 21.7 04/25/2021         General Chemistry:   Lab Results   Component Value Date    NA 139 07/18/2021    K 3.9 07/18/2021    CL 104 07/18/2021    CO2 30 07/18/2021    GAP 9 07/18/2021    BUN 15 07/18/2021    CR 1.3 10/24/2021    CR 1.15 07/18/2021    GLU 186 07/18/2021    GLU 120 03/26/2004    CA 9.2 07/18/2021    ALBUMIN 3.3 05/15/2021 LACTIC 2.3 01/04/2017    MG 2.1 04/25/2021    TOTBILI 1.2 05/15/2021    PO4 4.8 01/07/2012      Coagulation:   Lab Results   Component Value Date    PT 33.1 04/04/2021    PTT 32.9 04/04/2021    INR 2.8 04/04/2021    INR 2.1 08/19/2019     05/05/21 MPI stress test  Left Ventricular Ejection Fraction (post stress, in the resting state) =? 27 %.   This study is abnormal most particularly for the marked reduction in global left ventricular ejection fraction but also some suggestion of mild intensity anterolateral ischemia.  No other definite perfusion defects are present.  All myocardial segments are definitely viable  ?  Although atrial fibrillation may lower the calculated left ventricular ejection fraction the degree of LV dysfunction on the study appears to be out of proportion to the perfusion abnormalities and the presence of atrial fibrillation.    In aggregate the current study is intermediate risk in regards to predicted annual cardiovascular mortality rate.     08/11/21 Echo:  1. Normal LV size and systolic function. LVEF 55-65%  2. Mildly dilated RV with preserved systolic function.   3. Moderate left atrial dilatation.   4. Mild mitral regurgitation.   5. Estimated peak systolic PA pressure of 41 mmHg    10/24/21 EKG  Sinus rhythm with frequent premature ventricular complexes in a pattern of bigeminy  Minimal voltage criteria for LVH, may be normal variant ( [employer] product )  Nonspecific ST and T wave abnormality  Abnormal ECG  When compared with ECG of 12-Sep-2021 16:10,  premature ventricular complexes are now present  Nonspecific T wave abnormality, worse in Lateral leads    Anesthesia Plan    ASA score: 3

## 2021-11-01 ENCOUNTER — Encounter: Admit: 2021-11-01 | Discharge: 2021-11-01 | Payer: MEDICARE

## 2021-11-01 NOTE — Telephone Encounter
Called patient and reminded her to get her pre-procedure labs drawn. Patient states she will get labs drawn today. Reviewed plan with the patient. Patient verbalized understanding and does not have any further questions or concerns. No further education requested from patient. Patient has our contact information for future needs.

## 2021-11-02 ENCOUNTER — Encounter: Admit: 2021-11-02 | Discharge: 2021-11-02 | Payer: MEDICARE

## 2021-11-02 NOTE — Telephone Encounter
-----   Message from Golda Acre, LPN sent at [id number]/33/8329 10:19 AM CST -----  Regarding: RCP- fax lab orders  VM on triage line from patient at 10:04am.  York Spaniel that she called the hospital to do labs and they do not have orders for her pre labs.  Could we get labs to her hospital?  Call her at #[phone].

## 2021-11-02 NOTE — Telephone Encounter
RC to patient.   Patient would like her lab orders sent to [facility] hospital in [place].   Orders fax [phone]  Reviewed plan with the patient. Patient verbalized understanding and does not have any further questions or concerns. No further education requested from patient. Patient has our contact information for future needs.

## 2021-11-03 ENCOUNTER — Encounter: Admit: 2021-11-03 | Discharge: 2021-11-03 | Payer: MEDICARE

## 2021-11-03 DIAGNOSIS — I4819 Other persistent atrial fibrillation: Secondary | ICD-10-CM

## 2021-11-03 LAB — CBC
HEMATOCRIT: 39 — ABNORMAL LOW
HEMOGLOBIN: 12 — ABNORMAL LOW
MCH: 32 — ABNORMAL LOW
MCHC: 47 — ABNORMAL HIGH
MCV: 94
PLATELET COUNT: 240
RBC COUNT: 4.2 — ABNORMAL HIGH
RDW: 13
WBC COUNT: 4.6 — ABNORMAL HIGH

## 2021-11-03 LAB — BASIC METABOLIC PANEL
BLD UREA NITROGEN: 15
CHLORIDE: 102
CO2: 28
POTASSIUM: 3.8
SODIUM: 139

## 2021-11-03 LAB — MAGNESIUM: MAGNESIUM: 2

## 2021-11-09 ENCOUNTER — Encounter: Admit: 2021-11-09 | Discharge: 2021-11-09 | Payer: MEDICARE

## 2021-11-09 MED ORDER — DEXAMETHASONE SODIUM PHOSPHATE 4 MG/ML IJ SOLN
INTRAVENOUS | 0 refills | Status: DC
Start: 2021-11-09 — End: 2021-11-09
  Administered 2021-11-09: 14:00:00 4 mg via INTRAVENOUS

## 2021-11-09 MED ORDER — LIDOCAINE (PF) 200 MG/10 ML (2 %) IJ SYRG
INTRAVENOUS | 0 refills | Status: DC
Start: 2021-11-09 — End: 2021-11-09
  Administered 2021-11-09: 14:00:00 100 mg via INTRAVENOUS

## 2021-11-09 MED ORDER — ROCURONIUM 10 MG/ML IV SOLN
INTRAVENOUS | 0 refills | Status: DC
Start: 2021-11-09 — End: 2021-11-09
  Administered 2021-11-09: 14:00:00 50 mg via INTRAVENOUS

## 2021-11-09 MED ORDER — PHENYLEPHRINE 40 MCG/ML IN NS IV DRIP (STD CONC)
INTRAVENOUS | 0 refills | Status: DC
Start: 2021-11-09 — End: 2021-11-09
  Administered 2021-11-09 (×2): .2 ug/kg/min via INTRAVENOUS

## 2021-11-09 MED ORDER — SUGAMMADEX 100 MG/ML IV SOLN
INTRAVENOUS | 0 refills | Status: DC
Start: 2021-11-09 — End: 2021-11-09
  Administered 2021-11-09: 17:00:00 250 mg via INTRAVENOUS

## 2021-11-09 MED ORDER — FENTANYL CITRATE (PF) 50 MCG/ML IJ SOLN
INTRAVENOUS | 0 refills | Status: DC
Start: 2021-11-09 — End: 2021-11-09
  Administered 2021-11-09: 14:00:00 100 ug via INTRAVENOUS

## 2021-11-09 MED ORDER — PROPOFOL INJ 10 MG/ML IV VIAL
INTRAVENOUS | 0 refills | Status: DC
Start: 2021-11-09 — End: 2021-11-09
  Administered 2021-11-09: 14:00:00 80 mg via INTRAVENOUS
  Administered 2021-11-09 (×2): 20 mg via INTRAVENOUS

## 2021-11-09 MED ORDER — ONDANSETRON HCL (PF) 4 MG/2 ML IJ SOLN
INTRAVENOUS | 0 refills | Status: DC
Start: 2021-11-09 — End: 2021-11-09
  Administered 2021-11-09: 17:00:00 4 mg via INTRAVENOUS

## 2021-11-09 MED ORDER — SODIUM CHLORIDE 0.9 % IV SOLP
INTRAVENOUS | 0 refills | Status: DC
Start: 2021-11-09 — End: 2021-11-09
  Administered 2021-11-09: 14:00:00 via INTRAVENOUS

## 2021-11-09 MED ADMIN — PANTOPRAZOLE 40 MG PO TBEC [80436]: 40 mg | ORAL | @ 20:00:00 | NDC 65862056099

## 2021-11-09 MED ADMIN — SODIUM CHLORIDE 0.9 % IV SOLP [27838]: 1000 mL | @ 14:00:00 | Stop: 2021-11-09 | NDC 00264999900

## 2021-11-09 MED ADMIN — ACETAMINOPHEN 325 MG PO TAB [101]: 650 mg | ORAL | @ 20:00:00 | NDC 49483034010

## 2021-11-09 MED ADMIN — HEPARIN (PORCINE) IN 5 % DEX 20,000 UNIT/500 ML (40 UNIT/ML) IV SOLP [3628]: 2500 [IU]/h | INTRAVENOUS | @ 15:00:00 | Stop: 2021-11-09 | NDC 00264956710

## 2021-11-09 MED ADMIN — HEPARIN (PORCINE) 1,000 UNIT/ML IJ SOLN [10176]: 1000 mL | @ 14:00:00 | Stop: 2021-11-09 | NDC 63323054001

## 2021-11-09 MED ADMIN — HEPARIN (PORCINE) 1,000 UNIT/ML IJ SOLN [10176]: 3000 [IU] | INTRAVENOUS | @ 16:00:00 | Stop: 2021-11-09 | NDC 63323054011

## 2021-11-09 MED ADMIN — ACETAMINOPHEN 325 MG PO TAB [101]: 650 mg | ORAL | NDC 49483034010

## 2021-11-09 MED ADMIN — SUCRALFATE 1 GRAM PO TAB [11442]: 1 g | ORAL | @ 20:00:00 | Stop: 2021-12-09 | NDC 00093221005

## 2021-11-09 MED ADMIN — SODIUM CHLORIDE 0.9 % IV SOLP [27838]: 500 mL | @ 14:00:00 | Stop: 2021-11-09 | NDC 00338004903

## 2021-11-09 MED ADMIN — PROTAMINE 10 MG/ML IV SOLN [6677]: 10 mg | INTRAVENOUS | @ 17:00:00 | Stop: 2021-11-09 | NDC 63323022905

## 2021-11-09 MED ADMIN — HEPARIN (PORCINE) 1,000 UNIT/ML IJ SOLN [10176]: 11000 [IU] | INTRAVENOUS | @ 15:00:00 | Stop: 2021-11-09 | NDC 63323054011

## 2021-11-09 MED ADMIN — BUPIVACAINE (PF) 0.25 % (2.5 MG/ML) IJ SOLN [87866]: 13 mL | @ 15:00:00 | Stop: 2021-11-09 | NDC 00409155918

## 2021-11-09 MED ADMIN — HEPARIN (PORCINE) 1,000 UNIT/ML IJ SOLN [10176]: 500 mL | @ 14:00:00 | Stop: 2021-11-09 | NDC 63323054011

## 2021-11-09 MED ADMIN — MAGNESIUM OXIDE 400 MG (241.3 MG MAGNESIUM) PO TAB [10491]: 400 mg | ORAL | @ 20:00:00 | NDC 69367027102

## 2021-11-09 MED ADMIN — PROTAMINE 10 MG/ML IV SOLN [6677]: 30 mg | INTRAVENOUS | @ 17:00:00 | Stop: 2021-11-09 | NDC 63323022905

## 2021-11-10 ENCOUNTER — Encounter: Admit: 2021-11-10 | Discharge: 2021-11-10 | Payer: MEDICARE

## 2021-11-10 DIAGNOSIS — I480 Paroxysmal atrial fibrillation: Secondary | ICD-10-CM

## 2021-11-10 DIAGNOSIS — F99 Mental disorder, not otherwise specified: Secondary | ICD-10-CM

## 2021-11-10 DIAGNOSIS — E785 Hyperlipidemia, unspecified: Secondary | ICD-10-CM

## 2021-11-10 DIAGNOSIS — E538 Deficiency of other specified B group vitamins: Secondary | ICD-10-CM

## 2021-11-10 DIAGNOSIS — I509 Heart failure, unspecified: Secondary | ICD-10-CM

## 2021-11-10 DIAGNOSIS — M1990 Unspecified osteoarthritis, unspecified site: Secondary | ICD-10-CM

## 2021-11-10 DIAGNOSIS — I4892 Unspecified atrial flutter: Secondary | ICD-10-CM

## 2021-11-10 DIAGNOSIS — I471 Supraventricular tachycardia: Secondary | ICD-10-CM

## 2021-11-10 DIAGNOSIS — I2699 Other pulmonary embolism without acute cor pulmonale: Secondary | ICD-10-CM

## 2021-11-10 DIAGNOSIS — E119 Type 2 diabetes mellitus without complications: Secondary | ICD-10-CM

## 2021-11-10 DIAGNOSIS — Z9981 Dependence on supplemental oxygen: Secondary | ICD-10-CM

## 2021-11-10 DIAGNOSIS — Z7901 Long term (current) use of anticoagulants: Secondary | ICD-10-CM

## 2021-11-10 DIAGNOSIS — I10 Essential (primary) hypertension: Secondary | ICD-10-CM

## 2021-11-10 DIAGNOSIS — D509 Iron deficiency anemia, unspecified: Secondary | ICD-10-CM

## 2021-11-10 MED ORDER — FAMOTIDINE 40 MG PO TAB
40 mg | ORAL_TABLET | Freq: Two times a day (BID) | ORAL | 0 refills | 90.00000 days | Status: AC
Start: 2021-11-10 — End: ?

## 2021-11-10 MED ADMIN — SUCRALFATE 1 GRAM PO TAB [11442]: 1 g | ORAL | @ 02:00:00 | Stop: 2021-12-09 | NDC 00093221005

## 2021-11-10 MED ADMIN — ASPIRIN 81 MG PO TBEC [14113]: 81 mg | ORAL | @ 14:00:00 | Stop: 2021-11-10 | NDC 00536123441

## 2021-11-10 MED ADMIN — PANTOPRAZOLE 40 MG PO TBEC [80436]: 40 mg | ORAL | @ 14:00:00 | Stop: 2021-11-10 | NDC 65862056099

## 2021-11-10 MED ADMIN — HYDRALAZINE 20 MG/ML IJ SOLN [3697]: 10 mg | INTRAVENOUS | @ 12:00:00 | Stop: 2021-11-10 | NDC 00517090101

## 2021-11-10 MED ADMIN — BUMETANIDE 1 MG PO TAB [9310]: 1 mg | ORAL | @ 14:00:00 | Stop: 2021-11-10 | NDC 50268013111

## 2021-11-10 MED ADMIN — ROSUVASTATIN 20 MG PO TAB [88504]: 20 mg | ORAL | @ 02:00:00 | NDC 68462026390

## 2021-11-10 MED ADMIN — AMIODARONE 200 MG PO TAB [9066]: 200 mg | ORAL | @ 02:00:00 | NDC 51672402504

## 2021-11-10 MED ADMIN — PANTOPRAZOLE 40 MG PO TBEC [80436]: 40 mg | ORAL | @ 02:00:00 | NDC 65862056099

## 2021-11-10 MED ADMIN — INSULIN GLARGINE 100 UNIT/ML (3 ML) SC INJ PEN [163596]: 25 [IU] | SUBCUTANEOUS | @ 02:00:00 | NDC 00088221905

## 2021-11-10 MED ADMIN — RIVAROXABAN 20 MG PO TAB [309661]: 20 mg | ORAL | NDC 50458057901

## 2021-11-10 MED ADMIN — SERTRALINE 50 MG PO TAB [82509]: 100 mg | ORAL | @ 02:00:00 | NDC 00904692561

## 2021-11-10 MED ADMIN — MAGNESIUM OXIDE 400 MG (241.3 MG MAGNESIUM) PO TAB [10491]: 400 mg | ORAL | @ 02:00:00 | NDC 69367027102

## 2021-11-10 MED ADMIN — SACUBITRIL-VALSARTAN 97-103 MG PO TAB [325932]: 1 | ORAL | @ 14:00:00 | Stop: 2021-11-10 | NDC 00078069620

## 2021-11-10 MED ADMIN — SUCRALFATE 1 GRAM PO TAB [11442]: 1 g | ORAL | @ 14:00:00 | Stop: 2021-11-10 | NDC 00093221005

## 2021-11-10 MED ADMIN — MAGNESIUM OXIDE 400 MG (241.3 MG MAGNESIUM) PO TAB [10491]: 400 mg | ORAL | @ 14:00:00 | Stop: 2021-11-10 | NDC 69367027102

## 2021-11-10 MED ADMIN — INSULIN ASPART 100 UNIT/ML SC FLEXPEN [87504]: 2 [IU] | SUBCUTANEOUS | NDC 00169633910

## 2021-11-10 MED ADMIN — POTASSIUM CHLORIDE 20 MEQ PO TBTQ [35943]: 20 meq | ORAL | @ 14:00:00 | Stop: 2021-11-10 | NDC 00245531989

## 2021-11-14 ENCOUNTER — Encounter: Admit: 2021-11-14 | Discharge: 2021-11-14 | Payer: MEDICARE

## 2021-11-14 MED ORDER — RIVAROXABAN 20 MG PO TAB
20 mg | ORAL_TABLET | Freq: Every day | ORAL | 1 refills | 30.00000 days | Status: AC
Start: 2021-11-14 — End: ?

## 2021-11-16 ENCOUNTER — Encounter: Admit: 2021-11-16 | Discharge: 2021-11-16 | Payer: MEDICARE

## 2021-11-16 NOTE — Telephone Encounter
Call to the patient in follow up to a-fib ablation completed 12/22. 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     Patient denies chest pain, denies persistent nausea, vomiting or fullness, denies dysphagia, denies odynophagia, denies hematemesis/ melena, denies confusion, denies symptoms of stroke/TIA / STE, denies fever, chills rigors, denies symptoms of sepsis or systemic infection.  Patient denies dysuria, denies urinary frequency.  Patient denies swelling, denies bleeding, denies groin site pain.  Patient denies shortness of breath, denies weight gain, denies BLE swelling.     Verified appointment date/time/location with the patient for 1 mo post LAAA follow up. Scheduled on 1/30 with PW.    Patient will plan to call the office with any change in symptoms, questions or concerns.         Spoke to the patient. She denies any of the symptoms above. She does report having indegestion occasionally but reports it doesn't occur too often. I asked her to continue to monitor it and reach out to [place] if it worsens. She states it does go away when she takes OTC meds.     Reviewed plan with the patient. Patient verbalized understanding and does not have any further questions or concerns. No further education requested from patient. Patient has our contact information for future needs.

## 2021-11-21 ENCOUNTER — Encounter: Admit: 2021-11-21 | Discharge: 2021-11-21 | Payer: MEDICARE

## 2021-11-22 ENCOUNTER — Encounter: Admit: 2021-11-22 | Discharge: 2021-11-22 | Payer: MEDICARE

## 2021-11-22 DIAGNOSIS — I10 Essential (primary) hypertension: Secondary | ICD-10-CM

## 2021-11-22 DIAGNOSIS — I471 Supraventricular tachycardia: Secondary | ICD-10-CM

## 2021-11-22 DIAGNOSIS — I4819 Other persistent atrial fibrillation: Secondary | ICD-10-CM

## 2021-11-22 NOTE — Progress Notes
Labs and/or CXR for amiodarone surveillance due per Amiodarone protocol.  Lab requisitions/orders and letter explaining need for testing mailed to patient or given to patient in clinic.

## 2021-11-24 ENCOUNTER — Encounter: Admit: 2021-11-24 | Discharge: 2021-11-24 | Payer: MEDICARE

## 2021-11-27 ENCOUNTER — Encounter: Admit: 2021-11-27 | Discharge: 2021-11-27 | Payer: MEDICARE

## 2021-11-27 NOTE — Telephone Encounter
I heard that Mrs. Sandra Parks passed away on 2023-12-04 after being found down at home.  According to the family, she had gone to the bathroom and then was found unresponsive.  It's unclear how long she was down before being found.  CPR was started once EMS came to home.  She was noted to be asystolic and underwent CPR/intubation/epinephrine.  Unfortunately, she did not regain spontaneous circulation and a decision was made to discontinue resuscitative efforts.  She did not have an autopsy.    I spoke to her daughter Sandra Parks who was very tearful, stating her mother was feeling "the best she had ever felt".  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  She was able to cook and participate in family activities, even making candy with the great grandkids.  On the day in question, she had some diarrhea but otherwise denied any complaints.      I told Sandra Parks that I was so sorry for her loss.  I'm glad that she would have great memories going forward and it sounded like Sandra Parks had good quality of life before she passed.  I also told her that the cardiology office at [facility] is thinking about her and rest of family.  We will all keep them in our prayers.    Sandra Parks was appreciative of the call.  She will pass on our condolences to her father and rest of family.  I asked her to contact us if they need anything further.

## 2021-11-30 ENCOUNTER — Encounter: Admit: 2021-11-30 | Discharge: 2021-11-30 | Payer: MEDICARE

## 2021-12-18 ENCOUNTER — Encounter: Admit: 2021-12-18 | Discharge: 2021-12-18 | Payer: MEDICARE

## 2021-12-18 NOTE — Progress Notes
POST-MORTEM DOCUMENTATION    Name: Sandra Parks   MRN: [id number]     DOB: [birth year]      Age: 76 y.o.  Admission Date: (Not on file)     LOS: 0 days     Date of Service: 2022/01/05      Date of death if known:  2021-12-06  Location of death, if known:Home  How were you notified?  Phone  Who notified us of death? Dr. Bradly Bienenstock    Was hospice involved? No      Other information: Please see 1/9 telephone encounter

## 2021-12-20 DEATH — deceased

## 2022-11-14 IMAGING — CT CHEST WO(Adult)
2 of 6 series · 15 of 36 positions shown, 18 images · non-contrast
Comparison: none

[Series 4: thorax cor 1.50 br40 s3 · coronal · 0.64mm/px · 3 of 207 slices shown]
[im 42/207  lung]
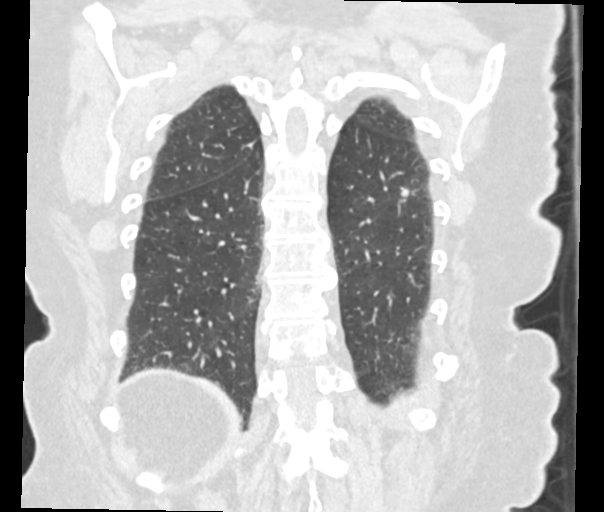
[im 83/207  lung]
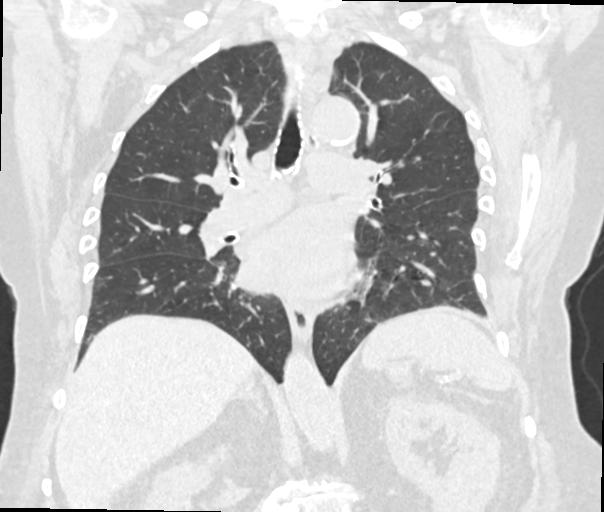
[im 124/207  lung]
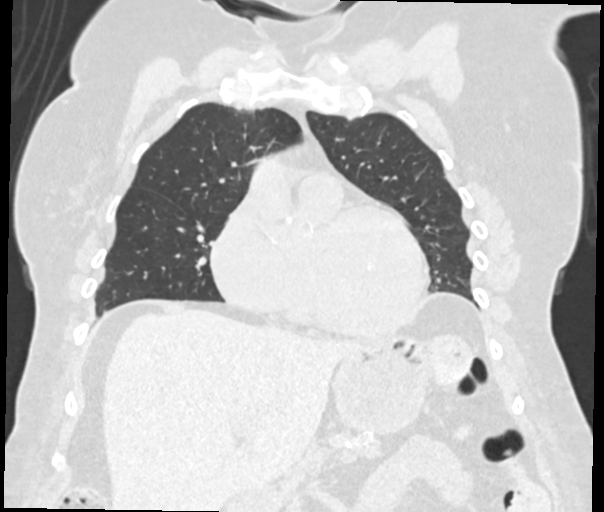

[Series 11: thorax 1.00 br60 s3 · axial · 0.76mm/px · z∈[+1715,+1991]mm · 12 of 447 slices shown, 15 images]
[im 35/447  mediastinal]
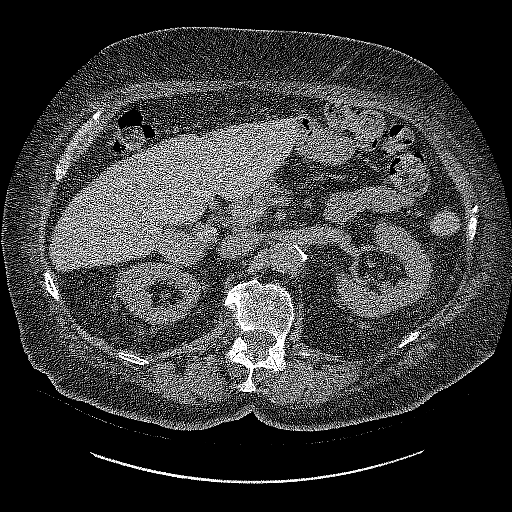
[im 35/447  lung]
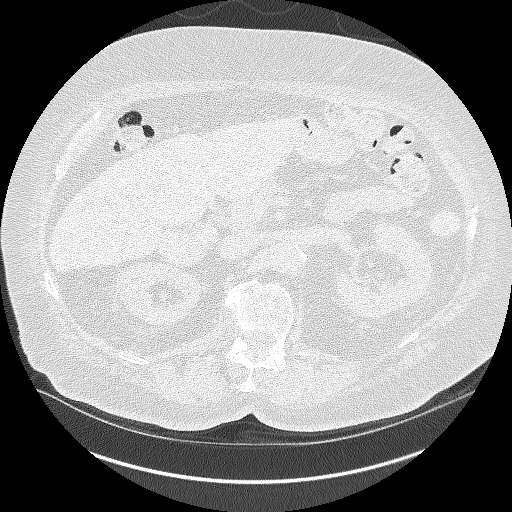
[im 69/447  lung]
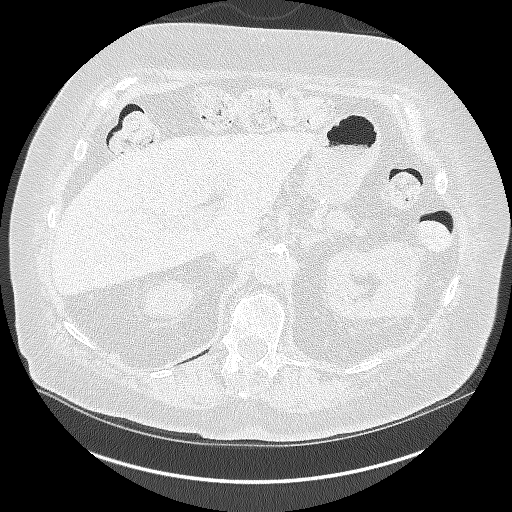
[im 103/447  lung]
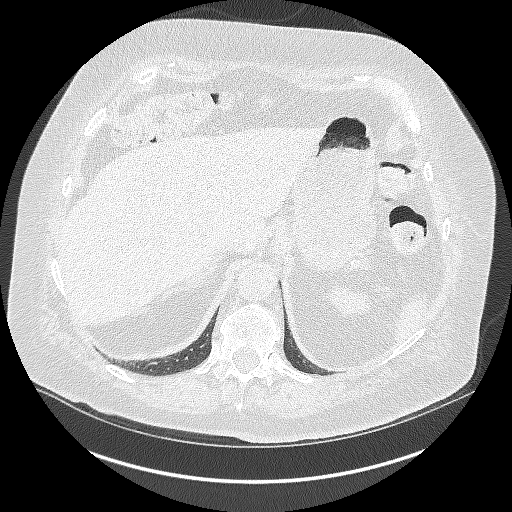
[im 138/447  lung]
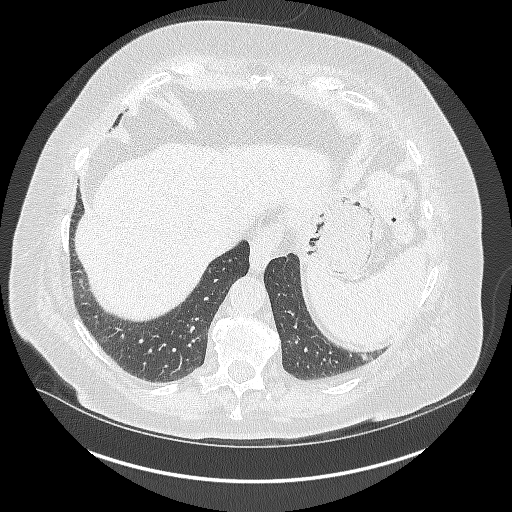
[im 172/447  mediastinal]
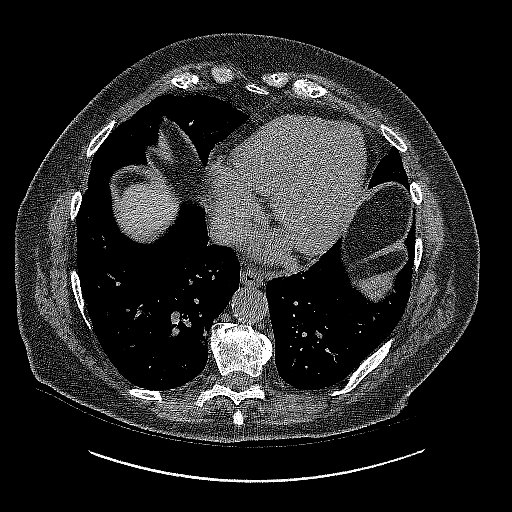
[im 172/447  lung]
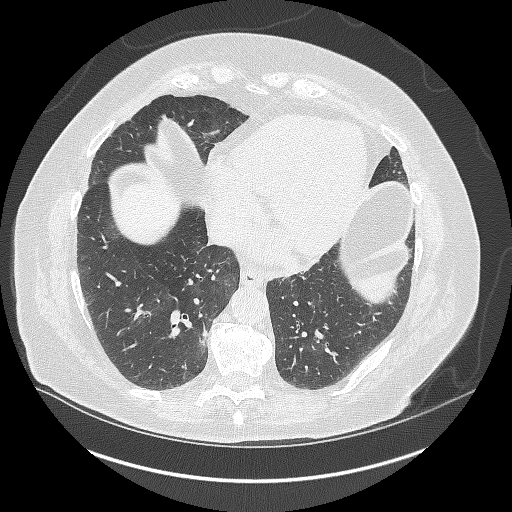
[im 206/447  lung]
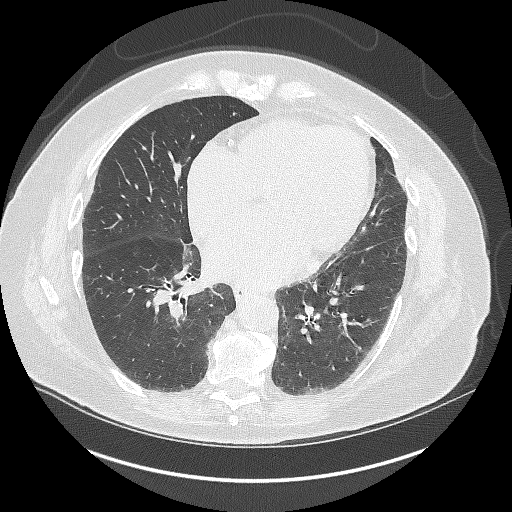
[im 241/447  lung]
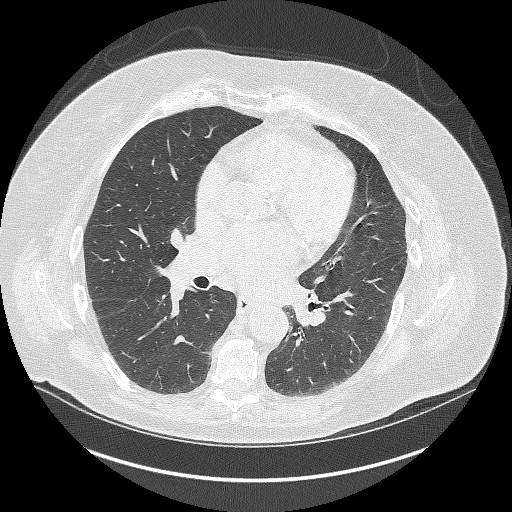
[im 275/447  lung]
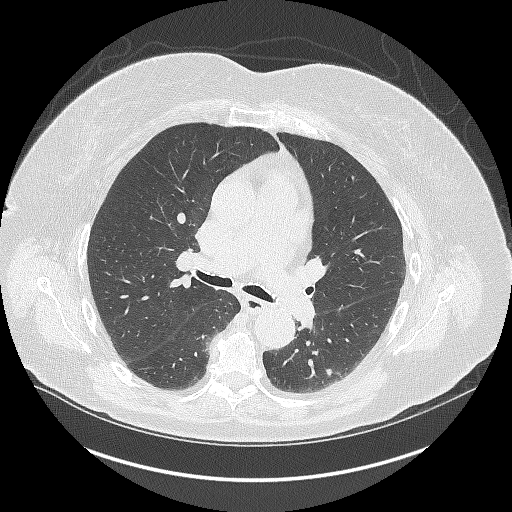
[im 309/447  mediastinal]
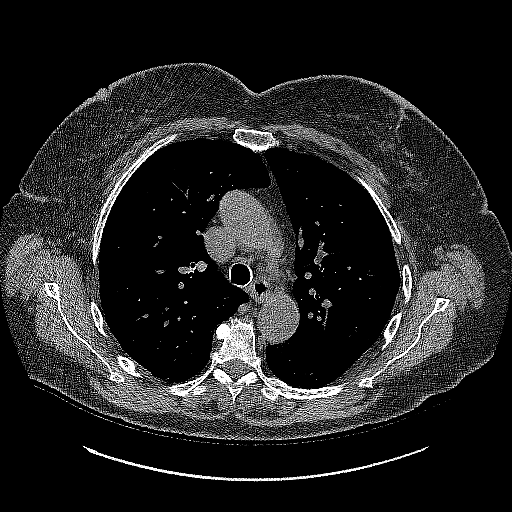
[im 309/447  lung]
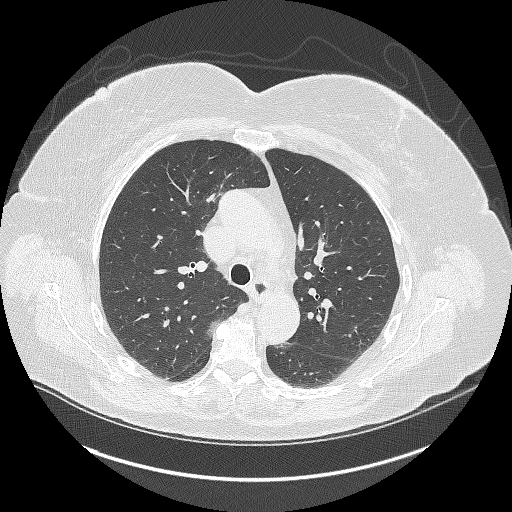
[im 344/447  lung]
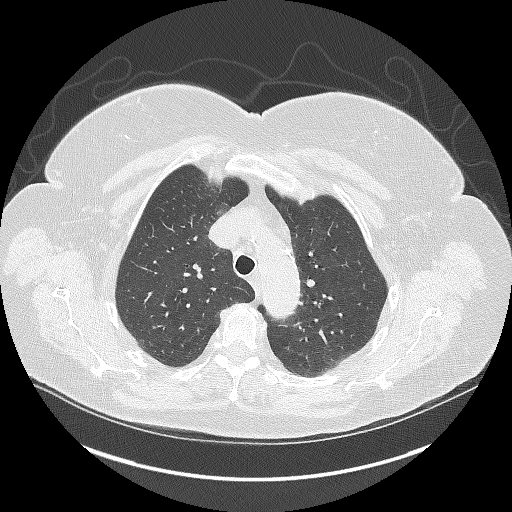
[im 378/447  lung]
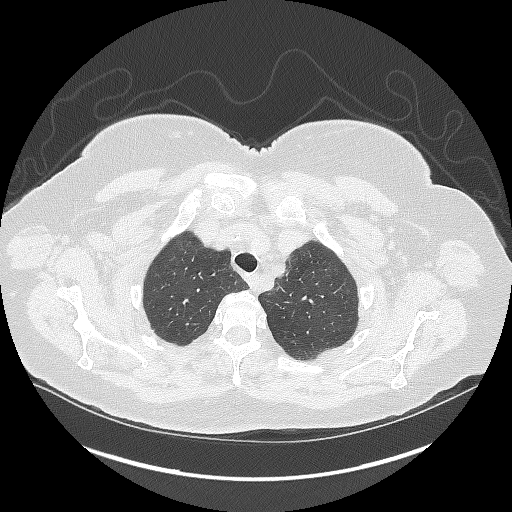
[im 412/447  lung]
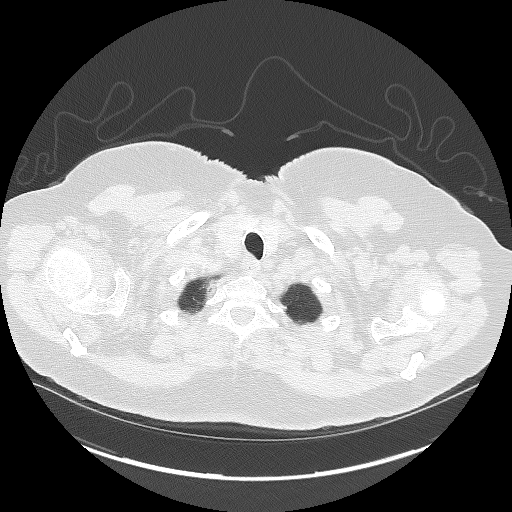

[15 of 36 positions shown; findings below may reference images not displayed]

DIAGNOSTIC STUDIES

EXAM

COMPUTED TOMOGRAPHY, THORAX; WITHOUT CONTRAST MATERIAL CPT 93870

INDICATION

PULMONARY NODULE
F/U PULMONARY NODULES. CT/NM 2/0. TJ

TECHNIQUE

Contiguous axial tomographic images were obtained from the lung apices to the upper abdomen without
intravenous contrast. Coronal and sagittal reformatted images were obtained.

All CT scans at this facility use dose modulation, iterative reconstruction, and/or weight based
dosing when appropriate to reduce radiation dose to as low as reasonably achievable.

2 CT and 0 nuclear scans in the last year.

COMPARISONS

CT  05/04/2021.

FINDINGS

The heart is borderline in size. No pericardial effusion. Moderate amount of calcified plaque
versus stents within the coronary arteries. No definite aortic aneurysm. Re-demonstration of a
heterogeneous right thyroid lobe. No pathologically enlarged lymph nodes. Small hiatal hernia. There
is artifact versus air within the gastric wall. No pleural effusion. 4 millimeter left lower lobe
nodule, image 152. Unchanged 4 millimeter left lower lobe nodule, image 104. Unchanged 5 millimeter
left lower lobe nodule, image 83. 2 millimeter left upper lobe nodule, image 37. Unchanged 6
millimeter left upper lobe nodule, image 56. Unchanged 3 millimeter left lower lobe nodule, image
113. Unchanged 4 millimeter left lower lobe nodule, image 137. Unchanged 4 millimeter right lower
lobe nodule, image 144. Unchanged 3 millimeter right lower lobe nodule, image 138. Unchanged 2
millimeter right upper lobe nodule, image 32. Unchanged 2 millimeter right upper lobe nodule, image
30. Unchanged 3 millimeter right middle lobe nodule, image 89. Unchanged 8 millimeter right middle
lobe nodule, image 117. Unchanged for 5 millimeter right middle lobe nodule, image 112. Unchanged 6
millimeter right lower lobe nodule, image 124. No pneumoperitoneum. The adrenal glands appear
grossly unremarkable. Osteopenia. Chronic anterior wedging of several mid thoracic vertebral bodies.

IMPRESSION

1. Unchanged pulmonary nodules. A 6 to 12 month follow-up is recommended to document continued 2
year stability.

2. Small hiatal hernia. There is artifact versus air within the gastric wall. Please correlate for
emphysematous gastritis.

Tech Notes:

F/U PULMONARY NODULES. CT/NM 2/0. TJ

## 2023-05-22 ENCOUNTER — Encounter: Admit: 2023-05-22 | Discharge: 2023-05-22 | Payer: MEDICARE
